# Patient Record
Sex: Male | Born: 1960 | Race: Black or African American | Hispanic: No | Marital: Single | Smoking: Current every day smoker
Health system: Southern US, Community
[De-identification: ages and names within clinical notes are randomized; demographics above are authoritative.]

## PROBLEM LIST (undated history)

## (undated) DIAGNOSIS — I10 Essential (primary) hypertension: Secondary | ICD-10-CM

## (undated) HISTORY — PX: AMPUTATION FINGER / THUMB: SUR24

---

## 1997-12-24 ENCOUNTER — Emergency Department (HOSPITAL_COMMUNITY): Admission: EM | Admit: 1997-12-24 | Discharge: 1997-12-24 | Payer: Self-pay | Admitting: Emergency Medicine

## 1997-12-31 ENCOUNTER — Emergency Department (HOSPITAL_COMMUNITY): Admission: EM | Admit: 1997-12-31 | Discharge: 1997-12-31 | Payer: Self-pay | Admitting: *Deleted

## 1998-01-08 ENCOUNTER — Emergency Department (HOSPITAL_COMMUNITY): Admission: EM | Admit: 1998-01-08 | Discharge: 1998-01-08 | Payer: Self-pay | Admitting: Emergency Medicine

## 1998-04-29 ENCOUNTER — Emergency Department (HOSPITAL_COMMUNITY): Admission: EM | Admit: 1998-04-29 | Discharge: 1998-04-29 | Payer: Self-pay | Admitting: Emergency Medicine

## 1998-09-09 ENCOUNTER — Emergency Department (HOSPITAL_COMMUNITY): Admission: EM | Admit: 1998-09-09 | Discharge: 1998-09-09 | Payer: Self-pay | Admitting: Emergency Medicine

## 1998-09-14 ENCOUNTER — Emergency Department (HOSPITAL_COMMUNITY): Admission: EM | Admit: 1998-09-14 | Discharge: 1998-09-14 | Payer: Self-pay | Admitting: Emergency Medicine

## 1998-11-25 ENCOUNTER — Emergency Department (HOSPITAL_COMMUNITY): Admission: EM | Admit: 1998-11-25 | Discharge: 1998-11-25 | Payer: Self-pay

## 1998-12-23 ENCOUNTER — Encounter: Payer: Self-pay | Admitting: Emergency Medicine

## 1998-12-23 ENCOUNTER — Emergency Department (HOSPITAL_COMMUNITY): Admission: EM | Admit: 1998-12-23 | Discharge: 1998-12-23 | Payer: Self-pay | Admitting: Emergency Medicine

## 2000-08-23 ENCOUNTER — Emergency Department (HOSPITAL_COMMUNITY): Admission: EM | Admit: 2000-08-23 | Discharge: 2000-08-23 | Payer: Self-pay | Admitting: Emergency Medicine

## 2001-03-06 ENCOUNTER — Emergency Department (HOSPITAL_COMMUNITY): Admission: EM | Admit: 2001-03-06 | Discharge: 2001-03-06 | Payer: Self-pay | Admitting: Emergency Medicine

## 2002-03-23 ENCOUNTER — Emergency Department (HOSPITAL_COMMUNITY): Admission: EM | Admit: 2002-03-23 | Discharge: 2002-03-23 | Payer: Self-pay | Admitting: *Deleted

## 2003-09-28 ENCOUNTER — Emergency Department (HOSPITAL_COMMUNITY): Admission: EM | Admit: 2003-09-28 | Discharge: 2003-09-28 | Payer: Self-pay | Admitting: Emergency Medicine

## 2004-11-16 ENCOUNTER — Emergency Department (HOSPITAL_COMMUNITY): Admission: EM | Admit: 2004-11-16 | Discharge: 2004-11-16 | Payer: Self-pay | Admitting: Emergency Medicine

## 2004-11-20 ENCOUNTER — Emergency Department (HOSPITAL_COMMUNITY): Admission: EM | Admit: 2004-11-20 | Discharge: 2004-11-20 | Payer: Self-pay | Admitting: Emergency Medicine

## 2005-01-18 ENCOUNTER — Emergency Department (HOSPITAL_COMMUNITY): Admission: EM | Admit: 2005-01-18 | Discharge: 2005-01-18 | Payer: Self-pay | Admitting: *Deleted

## 2005-07-21 ENCOUNTER — Emergency Department (HOSPITAL_COMMUNITY): Admission: EM | Admit: 2005-07-21 | Discharge: 2005-07-21 | Payer: Self-pay | Admitting: Emergency Medicine

## 2005-10-21 ENCOUNTER — Emergency Department (HOSPITAL_COMMUNITY): Admission: EM | Admit: 2005-10-21 | Discharge: 2005-10-21 | Payer: Self-pay | Admitting: Emergency Medicine

## 2006-09-27 ENCOUNTER — Emergency Department (HOSPITAL_COMMUNITY): Admission: EM | Admit: 2006-09-27 | Discharge: 2006-09-27 | Payer: Self-pay | Admitting: Emergency Medicine

## 2007-04-09 ENCOUNTER — Inpatient Hospital Stay (HOSPITAL_COMMUNITY): Admission: EM | Admit: 2007-04-09 | Discharge: 2007-04-10 | Payer: Self-pay | Admitting: Emergency Medicine

## 2008-02-08 ENCOUNTER — Ambulatory Visit: Payer: Self-pay | Admitting: *Deleted

## 2008-02-08 ENCOUNTER — Ambulatory Visit: Payer: Self-pay | Admitting: Internal Medicine

## 2008-02-08 LAB — CONVERTED CEMR LAB
BUN: 12 mg/dL (ref 6–23)
Basophils Relative: 0 % (ref 0–1)
CO2: 23 meq/L (ref 19–32)
Chloride: 106 meq/L (ref 96–112)
Cholesterol: 178 mg/dL (ref 0–200)
Eosinophils Absolute: 0.2 10*3/uL (ref 0.0–0.7)
Glucose, Bld: 95 mg/dL (ref 70–99)
HDL: 44 mg/dL (ref >39)
LDL Cholesterol: 101 mg/dL — ABNORMAL HIGH (ref 0–99)
Lymphs Abs: 2.4 10*3/uL (ref 0.7–4.0)
MCHC: 32.8 g/dL (ref 30.0–36.0)
MCV: 88.8 fL (ref 78.0–100.0)
Platelets: 302 10*3/uL (ref 150–400)
Potassium: 4.7 meq/L (ref 3.5–5.3)
RDW: 14.8 % (ref 11.5–15.5)
Sodium: 143 meq/L (ref 135–145)
Total CHOL/HDL Ratio: 4
Triglycerides: 163 mg/dL — ABNORMAL HIGH (ref <150)
VLDL: 33 mg/dL (ref 0–40)
WBC: 6.9 10*3/uL (ref 4.0–10.5)

## 2008-04-05 ENCOUNTER — Emergency Department (HOSPITAL_COMMUNITY): Admission: EM | Admit: 2008-04-05 | Discharge: 2008-04-05 | Payer: Self-pay | Admitting: Emergency Medicine

## 2008-07-11 ENCOUNTER — Emergency Department (HOSPITAL_COMMUNITY): Admission: EM | Admit: 2008-07-11 | Discharge: 2008-07-11 | Payer: Self-pay | Admitting: Family Medicine

## 2009-12-27 ENCOUNTER — Emergency Department (HOSPITAL_COMMUNITY): Admission: EM | Admit: 2009-12-27 | Discharge: 2009-12-27 | Payer: Self-pay | Admitting: Emergency Medicine

## 2010-11-10 ENCOUNTER — Inpatient Hospital Stay (INDEPENDENT_AMBULATORY_CARE_PROVIDER_SITE_OTHER)
Admission: RE | Admit: 2010-11-10 | Discharge: 2010-11-10 | Disposition: A | Discharge status: Home / Self Care | Payer: PRIVATE HEALTH INSURANCE | Source: Ambulatory Visit | Attending: Family Medicine | Admitting: Family Medicine

## 2010-11-10 DIAGNOSIS — H00019 Hordeolum externum unspecified eye, unspecified eyelid: Secondary | ICD-10-CM

## 2010-11-11 NOTE — Discharge Summary (Signed)
NAME:  [name], [name] NO.:  [id number]   MEDICAL RECORD NO.:  [id number]          PATIENT TYPE:  INP   LOCATION:  5035                         FACILITY:  [facility]   PHYSICIAN:  Johnette Abraham, MD    DATE OF BIRTH:  [birth year]   DATE OF ADMISSION:  04/09/2007  DATE OF DISCHARGE:  04/10/2007                               DISCHARGE SUMMARY   ADMITTING DIAGNOSES:  1. 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  2. Complex laceration of the left long finger.   PROCEDURES PERFORMED:  Please see the operative note, but the patient  was taken to the operating room for a composite graft and repair of the  left index finger, as well as repair of the left long finger.   HOSPITAL COURSE:  Postoperatively in the PACU, the patient's blood  pressure was elevated.  The patient is a known hypertensive patient who  is noncompliant with his medications; therefore, he was admitted to the  hospital for 23-hour observation.  Upon admission, a hospitalist was  consulted.  Please see their consult note for further recommendations.  They put the patient on lisinopril/hydrochlorothiazide combination for  blood pressure control.  On the morning of discharge, the patient's pain  was controlled.  Extensive conversation was had with the patient again  regarding the unlikelyness that the composite graft would take due to  the nature of the injury.  The patient agreed.  The patient should leave  his dressing intact, and do not remove it.  He is advised to elevate the  extremity.  He is given a prescription for Lortab for pain, and Keflex  to take.  Per recommendation of the hospitalist, he is given a  combination lisinopril/hydrochlorothiazide to take for his blood  pressure.  He is advised to not smoke, that smoking delays and prevents  wound healing and may be a factor in the survivability of the graft.   FOLLOWUP:  The patient should call the office in the morning for a  specific time to  see me in the office, and further followup arrangements  have been made with the hospitalist to see the patient back for a blood  pressure check.      Johnette Abraham, MD  Electronically Signed     HCC/MEDQ  D:  04/10/2007  T:  04/10/2007  Job:  [id number]

## 2010-11-11 NOTE — Op Note (Signed)
NAME:  [name], [name] NO.:  [id number]   MEDICAL RECORD NO.:  [id number]          PATIENT TYPE:  INP   LOCATION:  1830                         FACILITY:  [facility]   PHYSICIAN:  Johnette Abraham, MD    DATE OF BIRTH:  1960-11-29   DATE OF PROCEDURE:  04/09/2007  DATE OF DISCHARGE:                               OPERATIVE REPORT   SURGEON:  Johnette Abraham, MD   PREOPERATIVE DIAGNOSES:  1. Crush injury/partial amputation of the left index finger.  2. Crush/complex laceration to the left long finger.   POSTOPERATIVE DIAGNOSES:  1. Crush injury/partial amputation of the left index finger.  2. Crush/complex laceration to the left long finger.   ANESTHESIA:  General.   ESTIMATED BLOOD LOSS:  Minimal.   SPECIMENS:  None.   No acute complications.   PROCEDURES:  1. Open reduction and internal fixation of distal phalanx of the left      index finger.  2. Repair of nail bed, left index finger.  3. Composite graft tissue, left index finger.  4. Irrigation and debridement of skin and subcutaneous tissue and bone      in left index finger.  5. Exploration of wound, left long finger.  6. Complex closure of wound, left long finger, total of 3.5 cm.   INDICATIONS:  Mr. [name] is a 50 year old gentleman who caught his  fingers in a belt of the lawn mower this afternoon and sustained a  crushing distal amputation of the end of the left index finger as well  as a complex crush laceration to the left long finger.  He presented to  the emergency room department and had hand surgery was consulted  emergently.  The patient was adamant about trying to save the end of the  left index finger.  I cautioned him that after looking at his x-ray and  the appearance of the distal part, which was crushed and in multiple  shredded pieces, that this would be nearly impossible, however, I would  honor his wishes and try.  I did caution him that the likelihood of  success for composite  graft with this type of crush injury, the  survivability was very, very unlikely.  Consent was obtained.   PROCEDURE:  The patient was taken to the operating room.  General  anesthesia was administered.  The upper extremity was prepped in the  normal and sterile fashion.  A tourniquet was placed.  The arm was  exsanguinated and the tourniquet was inflated to 250 mmHg.  Initially,  the left index finger wound was evaluated.  There was complex amputation  that involved more of the volar ulnar side of the digit, at the base of  the nail matrix just distal to the DIP joint.  The bone was exposed.  The condition of the amputated part was also evaluated.  It was passed  to the back table soaked in Betadine solution.  The amputated part was  dirty; had grass and grease throughout the soft tissue.  The nail bed  was crushed an in  at least 5 pieces with some of nail bed missing.  The  distal phalanx bone was comminuted in at least 3 pieces.  The overall  condition of the skin was poor.  Following, a 22-gauge needle was used  to capture two of the fragments of the distal phalanx in the amputated  part to approximate them to the IF.  The pieces of the nail bed were  sutured together with interrupted 6-0 chromic sutures.  The skin was  debrided back and approximated to recreate a somewhat normal appearing  amputated finger tip.  In the meantime, the left index finger was  prepared for accepting the composite graft.  Skin and subcutaneous  tissues were debrided.  The tissues were dirty with grease embedded in  the soft tissues.  The bone was rongeured back approximately 2 mm.  Following, the 22-gauge needle which was holding the distal tip was  placed onto the left index finger and used as a K wire and driven into  the proximal part of the distal phalanx.  This reduced the composite  graft onto the left index finger.  The vessels and nerves at this level  were not repaired due to the location and  condition of them.  Following,  multiple interrupted sutures, 6-0 chromic and 4-0 nylon were used to  approximate the skin around the amputated part.  Next, the laceration to  the left long finger was evaluated.  The wound was explored.  The wound  was more on the ulnar side of the digit.  There were visible small  cutaneous nerves and vessels that were observed to be grossly intact.  This wound was superficial to the insertion of the flexor tendon.  This  wound was also dirty and stained with grease.  The skin and subcutaneous  tissues were carefully debrided, leaving the neurovascular structures  deep.  The laceration was stellate and needed to be closed with multiple  interrupted 5-0 nylon sutures.  The final result of the left long finger  was satisfactory.  Following, the tourniquet was released.  Tourniquet  time total was 43 minutes.  The left long finger tip returned to pink  color with good capillary refill.  The left index finger, all but the  composite graft, returned to normal color with adequate capillary  refill.  The skin edges surrounding the composite graft were pink in  color.  Hemostasis was obtained with minor direct pressure.  Afterwards,  the end of the 22-gauge needle was cut to length.  All wounds were  covered with antibiotic ointment and a sterile dressing.  A large bulky  dressing was placed.  The patient tolerated the procedure well, was  taken to the recovery room in stable condition.   Note, the patient was warned postoperatively that the chances for  survival of this composite graft were very, very poor and that the  patient would need an additional procedure with revision amputation if  this composite graft did not take.  It is my belief that the condition  of the amputated part will not be viable, however, an attempt was made  due to the patient's strong request that an attempt be made to salvage  this part.  He will be placed on IV antibiotics over night  and monitored  closely for signs and symptoms of infection, which will warrant early  operation in order to prevent jeopardizing any more proximal tissue.      Johnette Abraham, MD  Electronically Signed  HCC/MEDQ  D:  04/09/2007  T:  04/10/2007  Job:  [id number]

## 2010-11-11 NOTE — H&P (Signed)
[name], [name]                 ACCOUNT NO.:  [id number]   MEDICAL RECORD NO.:  [id number]           PATIENT TYPE:   LOCATION:                                 FACILITY:   PHYSICIAN:  Deirdre Peer. Polite, M.D. DATE OF BIRTH:  10/21/1950   DATE OF ADMISSION:  DATE OF DISCHARGE:                              HISTORY & PHYSICAL   REASON FOR CONSULTATION:  Hypertension management.   Patient was admitted to the surgery service __________ [name]  __________ .  Patient sustained an injury to his left hand, second and  third finger while trying to fix a running lawn mower.  While an  emergency department, patient was noted to have hypertension.  Blood  pressure is 192/117.  Patient states that he has a history of  hypertension and has been on some medications in the past.  He is unsure  of what those medications are.  Currently his blood pressure is 162/97.  He is alert and oriented; in no apparent distress.  He denies any  issues, any chest pain.  No shortness of breath.  No orthopnea.  No PND.   PAST MEDICAL HISTORY:  As stated above.  Hypertension.   CURRENT MEDICATIONS:  None.   SOCIAL HISTORY:  Positive for tobacco, half pack per day.  Positive for  alcohol, approximately 6-pack every other day.  Denies withdrawal  symptoms.  Denies drugs.   PAST SURGICAL HISTORY:  Recent left hand surgery to repair a laceration  to his second and third fingers.   ALLERGIES:  None.   FAMILY HISTORY:  Mother had cancer.  He is not sure if it was brain or  tumor.  Father hypertension.  One brother with diabetes.  He had 6  sisters.  Two deceased from breast cancer.   REVIEW OF SYSTEMS:  As stated in the HPI.  Otherwise, negative.   PHYSICAL EXAMINATION:  Alert and oriented x3.  Current blood pressure  162/97.  Current vitals are not documented.  Temperature in emergency  department is 99.4.  HEENT:  Unremarkable.  Chest:  Clear without rales,  rhonchi or wheezes.  Cardiovascular:  Regular.   S1, S3.  No S3  appreciated.  Abdomen:  Soft, nontender.  Extremities:  No edema.  Left  hand wrap from recent surgery.   ASSESSMENT:  1. Hypertension.  Probable essential hypertension.  Patient will be      started on lisinopril/hydrochlorothiazide 20/12.5.  Recommend      checking a BMET.  Patient will require other risk factor      stratification which can be done on an outpatient basis.  2. Tobacco use.  At this time, the patient states he is going through      a difficult time, and he wishes to continue      smoking.  3. Alcohol use to excess.  Again, the patient states he is going      through a difficult time.  These issues need to be further      addressed on an outpatient basis.      Deirdre Peer. Polite, M.D.  Electronically Signed     RDP/MEDQ  D:  04/09/2007  T:  04/09/2007  Job:  [id number]

## 2011-04-09 LAB — BASIC METABOLIC PANEL
CO2: 26
Calcium: 8.4
Creatinine, Ser: 0.81
Glucose, Bld: 89

## 2012-03-17 ENCOUNTER — Encounter (HOSPITAL_COMMUNITY): Payer: Self-pay | Admitting: Emergency Medicine

## 2012-03-17 ENCOUNTER — Emergency Department (HOSPITAL_COMMUNITY)
Admission: EM | Admit: 2012-03-17 | Discharge: 2012-03-17 | Disposition: A | Discharge status: Home / Self Care | Payer: Self-pay | Attending: Emergency Medicine | Admitting: Emergency Medicine

## 2012-03-17 DIAGNOSIS — IMO0002 Reserved for concepts with insufficient information to code with codable children: Secondary | ICD-10-CM

## 2012-03-17 DIAGNOSIS — L03019 Cellulitis of unspecified finger: Secondary | ICD-10-CM | POA: Insufficient documentation

## 2012-03-17 HISTORY — DX: Essential (primary) hypertension: I10

## 2012-03-17 MED ORDER — TRAMADOL HCL 50 MG PO TABS: 50.0000 mg | ORAL_TABLET | Freq: Four times a day (QID) | ORAL | Status: DC | PRN

## 2012-03-17 MED ORDER — CEPHALEXIN 500 MG PO CAPS: 1000.0000 mg | ORAL_CAPSULE | Freq: Two times a day (BID) | ORAL | Status: DC

## 2012-03-17 NOTE — ED Notes (Signed)
Pt was called x 2,  Pt returned to desk at 1245, then placed in room Pt alert, c/o swelling on r/thumb. Area around base of nail swollen

## 2012-03-17 NOTE — ED Provider Notes (Signed)
History     CSN: [id number]  Arrival date & time 03/17/12  1105   First MD Initiated Contact with Patient 03/17/12 1301    51 y.o. male in no acute distress complaining of pain to left thumb worsening over the course of 2 days. Patient denies any trauma, fever nausea vomiting.    Chief Complaint  Patient presents with  . Hand Pain    swelling on thumb of r/hand    (Consider location/radiation/quality/duration/timing/severity/associated sxs/prior treatment) The history is provided by the patient.    Past Medical History  Diagnosis Date  . Hypertension     Past Surgical History  Procedure Date  . Amputation finger / thumb     amputation of finger tip on l/hand    Family History  Problem Relation Age of Onset  . Hypertension Father     History  Substance Use Topics  . Smoking status: Current Every Day Smoker    Types: Cigarettes  . Smokeless tobacco: Not on file  . Alcohol Use: Yes      Review of Systems  Constitutional: Negative for fever.  Respiratory: Negative for shortness of breath.   Cardiovascular: Negative for chest pain.  Gastrointestinal: Negative for nausea, vomiting, abdominal pain and diarrhea.  Skin: Positive for rash.  All other systems reviewed and are negative.    Allergies  Review of patient's allergies indicates no known allergies.  Home Medications   Current Outpatient Rx  Name Route Sig Dispense Refill  . LISINOPRIL 20 MG PO TABS Oral Take 20 mg by mouth daily.      BP 124/79  Pulse 59  Temp 98.4 F (36.9 C) (Oral)  Wt 200 lb (90.719 kg)  SpO2 100%  Physical Exam  Nursing note and vitals reviewed. Constitutional: He is oriented to person, place, and time. He appears well-developed and well-nourished. No distress.  HENT:  Head: Normocephalic and atraumatic.  Right Ear: External ear normal.  Left Ear: External ear normal.  Mouth/Throat: Oropharynx is clear and moist.  Eyes: Conjunctivae normal and EOM are normal. Pupils  are equal, round, and reactive to light.  Neck: Normal range of motion.  Cardiovascular: Normal rate, regular rhythm and normal heart sounds.   Pulmonary/Chest: Effort normal and breath sounds normal. No stridor.  Abdominal: Soft. Bowel sounds are normal.  Musculoskeletal: Normal range of motion.  Neurological: He is alert and oriented to person, place, and time.  Skin:       Paronychia to the right thumb, no felon.  Psychiatric: He has a normal mood and affect.    ED Course  Procedures (including critical care time)  Labs Reviewed - No data to display No results found.   1. Paronychia       MDM  51 y.o. y/o male with non-fluctuant paronychia to right thumb, Non fluctuant. I will recommend warm compresses as well.  Pt verbalized understanding and agrees with care plan. Outpatient follow-up and return precautions given.    New Prescriptions   CEPHALEXIN (KEFLEX) 500 MG CAPSULE    Take 2 capsules (1,000 mg total) by mouth 2 (two) times daily.   TRAMADOL (ULTRAM) 50 MG TABLET    Take 1 tablet (50 mg total) by mouth every 6 (six) hours as needed for pain.          Wynetta Emery, PA-C 03/17/12 [phone]

## 2012-03-17 NOTE — ED Notes (Signed)
Pt called to room x 2. No answer. 

## 2012-03-18 NOTE — ED Provider Notes (Signed)
Medical screening examination/treatment/procedure(s) were performed by non-physician practitioner and as supervising physician I was immediately available for consultation/collaboration.   Myrla Malanowski, MD 03/18/12 1541 

## 2012-03-22 ENCOUNTER — Emergency Department (HOSPITAL_COMMUNITY)
Admission: EM | Admit: 2012-03-22 | Discharge: 2012-03-22 | Disposition: A | Discharge status: Home / Self Care | Payer: Self-pay | Attending: Emergency Medicine | Admitting: Emergency Medicine

## 2012-03-22 ENCOUNTER — Encounter (HOSPITAL_COMMUNITY): Payer: Self-pay

## 2012-03-22 DIAGNOSIS — Z8249 Family history of ischemic heart disease and other diseases of the circulatory system: Secondary | ICD-10-CM | POA: Insufficient documentation

## 2012-03-22 DIAGNOSIS — F17200 Nicotine dependence, unspecified, uncomplicated: Secondary | ICD-10-CM | POA: Insufficient documentation

## 2012-03-22 DIAGNOSIS — I10 Essential (primary) hypertension: Secondary | ICD-10-CM | POA: Insufficient documentation

## 2012-03-22 DIAGNOSIS — IMO0002 Reserved for concepts with insufficient information to code with codable children: Secondary | ICD-10-CM

## 2012-03-22 DIAGNOSIS — L03019 Cellulitis of unspecified finger: Secondary | ICD-10-CM | POA: Insufficient documentation

## 2012-03-22 MED ORDER — TRAMADOL HCL 50 MG PO TABS: 50.0000 mg | ORAL_TABLET | Freq: Four times a day (QID) | ORAL | Status: DC | PRN

## 2012-03-22 NOTE — ED Provider Notes (Signed)
Medical screening examination/treatment/procedure(s) were performed by non-physician practitioner and as supervising physician I was immediately available for consultation/collaboration.    Forest Pruden R Jasir Rother, MD 03/22/12 1658 

## 2012-03-22 NOTE — ED Provider Notes (Signed)
History     CSN: [id number]  Arrival date & time 03/22/12  4098   First MD Initiated Contact with Patient 03/22/12 1041      Chief Complaint  Patient presents with  . Wound Check    (Consider location/radiation/quality/duration/timing/severity/associated sxs/prior treatment) HPI  51 y.o. in no acute distress complaining of worsening pain to right hand thumb. Patient was seen for similar and given Keflex and advised warm compresses. Since then the infection has softened. He denies any fever, nausea or vomiting.  Past Medical History  Diagnosis Date  . Hypertension     Past Surgical History  Procedure Date  . Amputation finger / thumb     amputation of finger tip on l/hand    Family History  Problem Relation Age of Onset  . Hypertension Father     History  Substance Use Topics  . Smoking status: Current Every Day Smoker    Types: Cigarettes  . Smokeless tobacco: Not on file  . Alcohol Use: Yes      Review of Systems  Constitutional: Negative for fever.  Respiratory: Negative for shortness of breath.   Cardiovascular: Negative for chest pain.  Gastrointestinal: Negative for nausea, vomiting, abdominal pain and diarrhea.  Skin: Positive for rash.  All other systems reviewed and are negative.    Allergies  Review of patient's allergies indicates no known allergies.  Home Medications   Current Outpatient Rx  Name Route Sig Dispense Refill  . CEPHALEXIN 500 MG PO CAPS Oral Take 2 capsules (1,000 mg total) by mouth 2 (two) times daily. 28 capsule 0  . LISINOPRIL 20 MG PO TABS Oral Take 20 mg by mouth daily.    . TRAMADOL HCL 50 MG PO TABS Oral Take 1 tablet (50 mg total) by mouth every 6 (six) hours as needed for pain. 15 tablet 0    BP 138/85  Pulse 71  Temp 98.5 F (36.9 C) (Oral)  Resp 18  Ht 6\' 1"  (1.854 m)  Wt 210 lb (95.255 kg)  BMI 27.71 kg/m2  SpO2 100%  Physical Exam  Nursing note and vitals reviewed. Constitutional: He is oriented to  person, place, and time. He appears well-developed and well-nourished. No distress.  HENT:  Head: Normocephalic.  Eyes: Conjunctivae normal and EOM are normal.  Cardiovascular: Normal rate.   Pulmonary/Chest: Effort normal. No stridor.  Musculoskeletal: Normal range of motion.  Neurological: He is alert and oriented to person, place, and time.  Skin:       Fluctuant paronychia to right first digit. No felon  Psychiatric: He has a normal mood and affect.    ED Course  Procedures (including critical care time)  INCISION AND DRAINAGE Performed by: Wynetta Emery Consent: Verbal consent obtained. Risks and benefits: risks, benefits and alternatives were discussed Type: abscess  Body area: Right thumb   Drainage: purulent  Drainage amount: 0.5  Packing material: None   Patient tolerance: Patient tolerated the procedure well with no immediate complications.     Labs Reviewed - No data to display No results found.   1. Paronychia       MDM  Paronychia to right thumb in size with purulent drainage.   Pt verbalized understanding and agrees with care plan. Outpatient follow-up and return precautions given.           Wynetta Emery, PA-C 03/22/12 1454

## 2012-03-22 NOTE — ED Notes (Signed)
Pt here for recheck to ?infection to rt hand thumb.  Pt compliant with medication antibiotic yet concerned it has gotten worse- Pt reports tenderness only with yellow drainage

## 2012-08-22 ENCOUNTER — Encounter (HOSPITAL_COMMUNITY): Payer: Self-pay | Admitting: Emergency Medicine

## 2012-08-22 ENCOUNTER — Emergency Department (HOSPITAL_COMMUNITY)
Admission: EM | Admit: 2012-08-22 | Discharge: 2012-08-22 | Disposition: A | Discharge status: Home / Self Care | Payer: BC Managed Care – PPO | Source: Home / Self Care | Attending: Family Medicine | Admitting: Family Medicine

## 2012-08-22 ENCOUNTER — Emergency Department (INDEPENDENT_AMBULATORY_CARE_PROVIDER_SITE_OTHER): Payer: BC Managed Care – PPO

## 2012-08-22 DIAGNOSIS — M7742 Metatarsalgia, left foot: Secondary | ICD-10-CM

## 2012-08-22 DIAGNOSIS — I10 Essential (primary) hypertension: Secondary | ICD-10-CM

## 2012-08-22 DIAGNOSIS — M7750 Other enthesopathy of unspecified foot: Secondary | ICD-10-CM

## 2012-08-22 LAB — POCT I-STAT, CHEM 8
Creatinine, Ser: 1 mg/dL (ref 0.50–1.35)
Glucose, Bld: 95 mg/dL (ref 70–99)
Potassium: 4.2 mEq/L (ref 3.5–5.1)
Sodium: 142 mEq/L (ref 135–145)

## 2012-08-22 MED ORDER — LISINOPRIL-HYDROCHLOROTHIAZIDE 20-12.5 MG PO TABS: 1.0000 | ORAL_TABLET | Freq: Every day | ORAL | Status: DC

## 2012-08-22 MED ORDER — TRAMADOL HCL 50 MG PO TABS: 50.0000 mg | ORAL_TABLET | Freq: Four times a day (QID) | ORAL | Status: DC | PRN

## 2012-08-22 NOTE — ED Notes (Signed)
Patient reports sore left lateral foot.  Patient fell off a roof about a week ago, landed on foot and has had soreness since then.  Patient reports today he has been seeing spots .  Patient reports this is what happens when his blood pressure is elevated.  Patient has been off blood pressure medicine for 2 months.  Patient unable to report name of blood pressure medicine.  Patient denies chest pain.  Reports having headache.

## 2012-08-22 NOTE — ED Provider Notes (Signed)
History     CSN: [id number]  Arrival date & time 08/22/12  1256   First MD Initiated Contact with Patient 08/22/12 1329      Chief Complaint  Patient presents with  . Hypertension    (Consider location/radiation/quality/duration/timing/severity/associated sxs/prior treatment) HPI Comments: 52 year old male smoker with history of hypertension here complaining of left foot pain for one week. Patient states he was working on the for this house slipped and fell down to the ground landing on his left food and then on both hands. Denies neck or back injury. Reports pain with walking or bending his foot. Has not taken any medications for his symptoms. Patient was also found incidentally hypertensive. He states that has not taken his blood pressure medications for 2 months. Patient reports that he didn't have medical insurance. Patient report he does have insurance now and is trying to find a primary care provider. Patient reports intermittent dizziness and visual floaters. Denies chest pain, shortness of breath, headache, balance problems, extremity weakness numbness or paresthesias.    Past Medical History  Diagnosis Date  . Hypertension     Past Surgical History  Procedure Laterality Date  . Amputation finger / thumb      amputation of finger tip on l/hand    Family History  Problem Relation Age of Onset  . Hypertension Father     History  Substance Use Topics  . Smoking status: Current Every Day Smoker    Types: Cigarettes  . Smokeless tobacco: Not on file  . Alcohol Use: Yes      Review of Systems  Constitutional: Negative for fever, chills, diaphoresis and fatigue.  Respiratory: Negative for chest tightness and shortness of breath.   Cardiovascular: Negative for chest pain, palpitations and leg swelling.  Musculoskeletal:       Left foot pain as per HPI  Neurological: Negative for dizziness, seizures, syncope, weakness, numbness and headaches.  All other systems  reviewed and are negative.    Allergies  Review of patient's allergies indicates no known allergies.  Home Medications   Current Outpatient Rx  Name  Route  Sig  Dispense  Refill  . lisinopril-hydrochlorothiazide (PRINZIDE) 20-12.5 MG per tablet   Oral   Take 1 tablet by mouth daily.   30 tablet   1   . traMADol (ULTRAM) 50 MG tablet   Oral   Take 1 tablet (50 mg total) by mouth every 6 (six) hours as needed for pain.   20 tablet   0     BP 181/114  Pulse 83  Temp(Src) 98.8 F (37.1 C) (Oral)  Resp 17  SpO2 100%  Physical Exam  Nursing note and vitals reviewed. Constitutional: He is oriented to person, place, and time. He appears well-developed and well-nourished. No distress.  HENT:  Head: Normocephalic and atraumatic.  Eyes: Conjunctivae and EOM are normal. Pupils are equal, round, and reactive to light. No scleral icterus.  Neck: Neck supple. No JVD present. No thyromegaly present.  Cardiovascular: Normal rate, regular rhythm and normal heart sounds.  Exam reveals no gallop and no friction rub.   No murmur heard. Pulmonary/Chest: Effort normal and breath sounds normal.  Abdominal: Soft. He exhibits no mass.  Musculoskeletal:  Left foot/ankle: no swelling or tender to palpation around malleoli. There is reported tenderness to palpation dorsally over lateral border and also sole between mid and hind foot (lateral tarso-metatarsal area) Normal dorsiflexion and plantaeextension due to pain, no laxity; weight bearing but very tender with walking.  No skin bruise, heamatomas laceration or abrasions.   Neurological: He is alert and oriented to person, place, and time.  Skin: No rash noted. He is not diaphoretic.    ED Course  Procedures (including critical care time)  Labs Reviewed  POCT I-STAT, CHEM 8   Dg Foot Complete Left  08/22/2012  *RADIOLOGY REPORT*  Clinical Data: Foot pain, fall.  LEFT FOOT - COMPLETE 3+ VIEW  Comparison: None.  Findings: No acute bony  abnormality.  Specifically, no acute fracture, subluxation, or dislocation.  Soft tissues are intact.  Probable old injury along the tip of the malleolus with well corticated bone fragment.  IMPRESSION: No acute bony abnormality.   Original Report Authenticated By: Charlett Nose, M.D.      1. Hypertension   2. Metatarsalgia, left     EKG: Sinus rhythm with ventricular rate 78 beats per minutes. Impress poor R wave progression versus right bundle branch block with no acute ischemic changes. No prior EKG for comparison.  MDM  52 y/o male h/o HTN off medications for 4 moths here with HTN (asymptomatic) with normal electrolites and ccreatinine. Also here for left foot injury. No acute fx on xrays.  Refilled lisinopril/hctz12.5mg . Placed on post op shoe. Will avoid NSAID's due to HTN prescribed tramadol.  Encourage follow up with PCP patient has medical insurance I provided list with contact info of primary care offices in our area. Supportive care and red flags that should prompt return to medical attention discussed with patient and provided in writing.         Sharin Grave, MD 08/23/12 [phone]

## 2013-03-27 ENCOUNTER — Encounter (HOSPITAL_COMMUNITY): Payer: Self-pay | Admitting: Emergency Medicine

## 2013-03-27 ENCOUNTER — Emergency Department (HOSPITAL_COMMUNITY)
Admission: EM | Admit: 2013-03-27 | Discharge: 2013-03-27 | Disposition: A | Discharge status: Home / Self Care | Payer: BC Managed Care – PPO | Attending: Emergency Medicine | Admitting: Emergency Medicine

## 2013-03-27 ENCOUNTER — Emergency Department (HOSPITAL_COMMUNITY): Payer: BC Managed Care – PPO

## 2013-03-27 DIAGNOSIS — Y9389 Activity, other specified: Secondary | ICD-10-CM | POA: Insufficient documentation

## 2013-03-27 DIAGNOSIS — M25562 Pain in left knee: Secondary | ICD-10-CM

## 2013-03-27 DIAGNOSIS — I10 Essential (primary) hypertension: Secondary | ICD-10-CM

## 2013-03-27 DIAGNOSIS — M25569 Pain in unspecified knee: Secondary | ICD-10-CM | POA: Insufficient documentation

## 2013-03-27 DIAGNOSIS — M25572 Pain in left ankle and joints of left foot: Secondary | ICD-10-CM

## 2013-03-27 DIAGNOSIS — F17200 Nicotine dependence, unspecified, uncomplicated: Secondary | ICD-10-CM | POA: Insufficient documentation

## 2013-03-27 DIAGNOSIS — X500XXA Overexertion from strenuous movement or load, initial encounter: Secondary | ICD-10-CM | POA: Insufficient documentation

## 2013-03-27 DIAGNOSIS — S8990XA Unspecified injury of unspecified lower leg, initial encounter: Secondary | ICD-10-CM | POA: Insufficient documentation

## 2013-03-27 DIAGNOSIS — Y929 Unspecified place or not applicable: Secondary | ICD-10-CM | POA: Insufficient documentation

## 2013-03-27 DIAGNOSIS — R609 Edema, unspecified: Secondary | ICD-10-CM | POA: Insufficient documentation

## 2013-03-27 MED ORDER — ACETAMINOPHEN 325 MG PO TABS
650.0000 mg | ORAL_TABLET | Freq: Once | ORAL | Status: AC
  Administered 2013-03-27: 650 mg via ORAL
  Filled 2013-03-27: qty 2

## 2013-03-27 MED ORDER — LISINOPRIL-HYDROCHLOROTHIAZIDE 20-12.5 MG PO TABS: 1.0000 | ORAL_TABLET | Freq: Every day | ORAL | Status: DC

## 2013-03-27 NOTE — ED Notes (Addendum)
Pt reports that yesterday he was stepping off some stairs and twisted his left ankle. Pt reports 7/10 pain to the lateral aspect of the ankle. Pt also c/o left sided knee pain. Pt also reports that he ran out of his blood pressure medication three months ago and needs a refill. BP on arrival is 158/114. Pt is A/O x4 and in  NAD.

## 2013-03-27 NOTE — ED Provider Notes (Signed)
CSN: [id number]     Arrival date & time 03/27/13  1024 History  This chart was scribed for non-physician practitioner Coral Ceo, PA-C working with Lyanne Co, MD by Joaquin Music, ED Scribe. This patient was seen in room WTR8/WTR8 and the patient's care was started at 12:36 PM     Chief Complaint  Patient presents with  . Ankle Pain  . Knee Pain  . Medication Refill   The history is provided by the patient. No language interpreter was used.    HPI Comments: [name] is a 52 y.o. male with a PMH of HTN who presents to the Emergency Department complaining of left ankle and knee pain.  He states that he missed the bottom step on the stairs and "rolled his ankle."  He describes an inversion type of injury.  He denies any other injuries.  He reports associated ankle edema with no open wounds, erythema, or ecchymosis.  He complains of throbbing constant pain.  He also reports difficulty with ambulation due to pain but denies any weakness, paresthesias, or loss of sensation.  He has not taken or done anything to tx the symptoms. He has a hx of ankle fx in highschool several years ago.  Patient denies any fever, chills, change in activity/appetite, neck pain, back pain, chest pain, SOB, abdominal pain, nausea, emesis, headache, dizziness, or lightheadedness.  His BP is elevated on today's visit.  He has been out of his lisinopril-HCTZ for the past 3 months.     Past Medical History  Diagnosis Date  . Hypertension    Past Surgical History  Procedure Laterality Date  . 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 Hypertension Father    History  Substance Use Topics  . Smoking status: Current Every Day Smoker    Types: Cigarettes  . Smokeless tobacco: Not on file  . Alcohol Use: Yes    Review of Systems  Constitutional: Negative for fever, chills, activity change, appetite change and fatigue.   HENT: Negative for neck pain.   Eyes: Negative for visual disturbance.  Respiratory: Negative for shortness of breath.   Cardiovascular: Negative for chest pain.  Gastrointestinal: Negative for nausea, vomiting and abdominal pain.  Genitourinary: Negative for dysuria and hematuria.  Musculoskeletal: Positive for joint swelling and gait problem (due to pain). Negative for myalgias and back pain.  Skin: Negative for rash and wound.  Neurological: Negative for dizziness, syncope, weakness, light-headedness, numbness and headaches.  Psychiatric/Behavioral: Negative for confusion.    Allergies  Review of patient's allergies indicates no known allergies.  Home Medications  No current outpatient prescriptions on file. BP 158/114  Pulse 103  Temp(Src) 99.4 F (37.4 C) (Oral)  SpO2 99%  Filed Vitals:   03/27/13 1117 03/27/13 1352 03/27/13 1413  BP: 158/114 172/103 167/109  Pulse: 103 84 76  Temp: 99.4 F (37.4 C) 98.8 F (37.1 C) 98.7 F (37.1 C)  TempSrc: Oral Oral Oral  Resp:  18 18  SpO2: 99% 100% 100%    Physical Exam  Nursing note and vitals reviewed. Constitutional: He is oriented to person, place, and time. He appears well-developed and well-nourished. No distress.  HENT:  Head: Normocephalic and atraumatic.  Right Ear: External ear normal.  Left Ear: External ear normal.  Nose: Nose normal.  Eyes: Conjunctivae and EOM are normal. Right eye exhibits no discharge. Left eye exhibits  no discharge.  Neck: Normal range of motion. Neck supple. No tracheal deviation present.  Cardiovascular: Normal rate, regular rhythm, normal heart sounds and intact distal pulses.  Exam reveals no gallop and no friction rub.   No murmur heard. Dorsalis pedis pulses present bilaterally   Pulmonary/Chest: Effort normal and breath sounds normal. No respiratory distress. He has no wheezes. He has no rales. He exhibits no tenderness.  Abdominal: Soft. He exhibits no distension. There is no  tenderness.  Musculoskeletal: Normal range of motion. He exhibits edema. He exhibits no tenderness.  Edema to the left lateral ankle with tenderness to the lateral malleolus throughout.  Patient able to flex and extend his left knee without difficulty or limitations.  No left knee tenderness.  Patient able to dorsiflex and plantar flex his left ankle.   Neurological: He is alert and oriented to person, place, and time.  Sensation intact in the lower extremities   Skin: Skin is warm and dry. He is not diaphoretic.  No open wounds, erythema, or ecchymosis to the lower extremities throughout  Psychiatric: He has a normal mood and affect. His behavior is normal.    ED Course  Procedures  DIAGNOSTIC STUDIES: Oxygen Saturation is 99% on RA, normal by my interpretation.    COORDINATION OF CARE: 12:38 PM-Discussed treatment plan which includes administering Tylenol with pt at bedside and pt agreed to plan.   Labs Review Labs Reviewed - No data to display Imaging Review Dg Ankle Complete Left  03/27/2013   CLINICAL DATA:  Ankle pain post twisting ankle  EXAM: LEFT ANKLE COMPLETE - 3+ VIEW  COMPARISON:  08/22/2012  FINDINGS: There is no evidence of fracture, dislocation, or joint effusion. There is no evidence of arthropathy or other focal bone abnormality. Soft tissues are unremarkable. Well corticated bony fragment adjacent to distal tibia medial malleolus is stable from prior exam probable from prior injury.  IMPRESSION: Negative.   Electronically Signed   By: Natasha Mead   On: 03/27/2013 13:32   Dg Knee Complete 4 Views Left  03/27/2013   CLINICAL DATA:  Left knee pain following injury.  EXAM: LEFT KNEE - COMPLETE 4+ VIEW  COMPARISON:  None.  FINDINGS: There is no evidence of fracture, dislocation, or joint effusion. There is no evidence of arthropathy or other focal bone abnormality. Soft tissues are unremarkable.  IMPRESSION: No acute abnormality noted.   Electronically Signed   By: Alcide Clever    On: 03/27/2013 13:30   DG Ankle Complete Left (Final result)  Result time: 03/27/13 13:32:23    Final result by Rad Results In Interface (03/27/13 13:32:23)    Narrative:   CLINICAL DATA: Ankle pain post twisting ankle  EXAM: LEFT ANKLE COMPLETE - 3+ VIEW  COMPARISON: 08/22/2012  FINDINGS: There is no evidence of fracture, dislocation, or joint effusion. There is no evidence of arthropathy or other focal bone abnormality. Soft tissues are unremarkable. Well corticated bony fragment adjacent to distal tibia medial malleolus is stable from prior exam probable from prior injury.  IMPRESSION: Negative.   Electronically Signed By: Natasha Mead On: 03/27/2013 13:32             DG Knee Complete 4 Views Left (Final result)  Result time: 03/27/13 13:30:27    Final result by Rad Results In Interface (03/27/13 13:30:27)    Narrative:   CLINICAL DATA: Left knee pain following injury.  EXAM: LEFT KNEE - COMPLETE 4+ VIEW  COMPARISON: None.  FINDINGS: There is no evidence  of fracture, dislocation, or joint effusion. There is no evidence of arthropathy or other focal bone abnormality. Soft tissues are unremarkable.  IMPRESSION: No acute abnormality noted.   Electronically Signed By: Alcide Clever On: 03/27/2013 13:30         MDM   1. Ankle pain, left   2. Knee pain, left   3. Hypertension     [name] is a 52 y.o. male with a PMH of HTN who presents to the Emergency Department complaining of left ankle and knee pain.  X-rays of left ankle and knee ordered.  Tylenol ordered for pain.  Ankle brace and crutches given.    Etiology of ankle and knee pain is possibly due to sprain.  X-rays negative for fx or dislocation. Patient was neurovascularly intact. Patient was prescribed a 30 day supply of lisinopril-HCTZ for outpatient management of his BP.  BP elevated on today's visit likely due to medication non-compliance.  He was instructed to follow-up with his PCP for  further refills and management of his ankle and knee pain. Patient was instructed to return to the ED if they experience any increased edema/erythema, fever, severe pain, weaknes, or other concerns.  Patient was in agreement with discharge and plan.     Final impressions: 1. Ankle pain 2. Knee pain  3. Hypertension     Luiz Iron PA-C   I personally performed the services described in this documentation, which was scribed in my presence. The recorded information has been reviewed and is accurate.    Jillyn Ledger, PA-C 03/28/13 1616

## 2013-03-27 NOTE — Progress Notes (Signed)
Orthopedic Tech Progress Note Patient Details:  [name] Jun 14, 1961 [id number] Ankle ASO applied to Left LE. Crutches fitted for height and comfort Ortho Devices Type of Ortho Device: ASO;Crutches Ortho Device/Splint Location: Left LE Ortho Device/Splint Interventions: Application   [name] 03/27/2013, 1:58 PM

## 2013-03-28 NOTE — ED Provider Notes (Signed)
Medical screening examination/treatment/procedure(s) were performed by non-physician practitioner and as supervising physician I was immediately available for consultation/collaboration.  Lyanne Co, MD 03/28/13 [phone]

## 2013-03-30 NOTE — ED Notes (Signed)
Patient needed note for work 

## 2013-06-02 ENCOUNTER — Ambulatory Visit (INDEPENDENT_AMBULATORY_CARE_PROVIDER_SITE_OTHER): Payer: BC Managed Care – PPO | Admitting: Emergency Medicine

## 2013-06-02 ENCOUNTER — Encounter (HOSPITAL_COMMUNITY): Payer: Self-pay | Admitting: Emergency Medicine

## 2013-06-02 ENCOUNTER — Emergency Department (HOSPITAL_COMMUNITY)
Admission: EM | Admit: 2013-06-02 | Discharge: 2013-06-02 | Disposition: A | Discharge status: Home / Self Care | Payer: BC Managed Care – PPO | Attending: Emergency Medicine | Admitting: Emergency Medicine

## 2013-06-02 VITALS — BP 180/118 | HR 95 | Temp 98.4°F | Resp 16 | Ht 71.0 in | Wt 209.2 lb

## 2013-06-02 DIAGNOSIS — Z9114 Patient's other noncompliance with medication regimen: Secondary | ICD-10-CM

## 2013-06-02 DIAGNOSIS — I10 Essential (primary) hypertension: Secondary | ICD-10-CM

## 2013-06-02 DIAGNOSIS — F17200 Nicotine dependence, unspecified, uncomplicated: Secondary | ICD-10-CM | POA: Insufficient documentation

## 2013-06-02 DIAGNOSIS — Z91199 Patient's noncompliance with other medical treatment and regimen due to unspecified reason: Secondary | ICD-10-CM | POA: Insufficient documentation

## 2013-06-02 DIAGNOSIS — Z569 Unspecified problems related to employment: Secondary | ICD-10-CM

## 2013-06-02 DIAGNOSIS — R42 Dizziness and giddiness: Secondary | ICD-10-CM

## 2013-06-02 DIAGNOSIS — Z566 Other physical and mental strain related to work: Secondary | ICD-10-CM

## 2013-06-02 DIAGNOSIS — Z9119 Patient's noncompliance with other medical treatment and regimen: Secondary | ICD-10-CM | POA: Insufficient documentation

## 2013-06-02 LAB — POCT CBC
MCH, POC: 30.3 pg (ref 27–31.2)
MCHC: 31.7 g/dL — AB (ref 31.8–35.4)
MID (cbc): 0.5 (ref 0–0.9)
MPV: 9.2 fL (ref 0–99.8)
POC Granulocyte: 2.5 (ref 2–6.9)
Platelet Count, POC: 257 10*3/uL (ref 142–424)
RBC: 4.89 M/uL (ref 4.69–6.13)

## 2013-06-02 LAB — COMPREHENSIVE METABOLIC PANEL
ALT: 31 U/L (ref 0–53)
Alkaline Phosphatase: 74 U/L (ref 39–117)
BUN: 11 mg/dL (ref 6–23)
CO2: 27 mEq/L (ref 19–32)
Calcium: 9.2 mg/dL (ref 8.4–10.5)
Potassium: 4 mEq/L (ref 3.5–5.3)
Sodium: 139 mEq/L (ref 135–145)
Total Bilirubin: 0.3 mg/dL (ref 0.3–1.2)
Total Protein: 7.1 g/dL (ref 6.0–8.3)

## 2013-06-02 LAB — GLUCOSE, POCT (MANUAL RESULT ENTRY): POC Glucose: 77 mg/dl (ref 70–99)

## 2013-06-02 MED ORDER — LISINOPRIL 20 MG PO TABS
20.0000 mg | ORAL_TABLET | Freq: Once | ORAL | Status: DC
  Filled 2013-06-02: qty 1

## 2013-06-02 MED ORDER — LISINOPRIL-HYDROCHLOROTHIAZIDE 20-12.5 MG PO TABS: 1.0000 | ORAL_TABLET | Freq: Every day | ORAL | Status: DC

## 2013-06-02 MED ORDER — HYDROCHLOROTHIAZIDE 12.5 MG PO CAPS
12.5000 mg | ORAL_CAPSULE | Freq: Once | ORAL | Status: DC
  Filled 2013-06-02: qty 1

## 2013-06-02 NOTE — ED Notes (Signed)
Pt advised that medication was in route from pharmacy.  Pt advised that a note would sent to pharmacy to get medication.  Nurse came to desk to send note and when nurse returned patient had left on his own accord.  Pt was not given his medication.

## 2013-06-02 NOTE — Patient Instructions (Signed)
Please Go straight to [name] for evaluation.

## 2013-06-02 NOTE — ED Notes (Signed)
Per pt has dizzy spell at work this am, lasted 30 minutes-resolved-saw PCP and he advised ED eval for CT, etc

## 2013-06-02 NOTE — ED Provider Notes (Signed)
Medical screening examination/treatment/procedure(s) were performed by non-physician practitioner and as supervising physician I was immediately available for consultation/collaboration.  EKG Interpretation   None         Lyanne Co, MD 06/02/13 2215

## 2013-06-02 NOTE — ED Provider Notes (Signed)
CSN: [id number]     Arrival date & time 06/02/13  1727 History   First MD Initiated Contact with Patient 06/02/13 1802     Chief Complaint  Patient presents with  . Hypertension   (Consider location/radiation/quality/duration/timing/severity/associated sxs/prior Treatment) Patient is a 52 y.o. male presenting with hypertension. The history is provided by the patient. No language interpreter was used.  Hypertension This is a recurrent problem. Pertinent negatives include no chills, fever, headaches, numbness or weakness. Associated symptoms comments: He has a history of high blood pressure, out of current medications for about one month. He saw his doctor this afternoon for a refill of medications and was sent here for evaluation of reported dizzy spell he has this morning around 11:30 that lasted approximately 10 minutes. No pain, visual change, nausea, headache, loss of coordination or imbalance. .    Past Medical History  Diagnosis Date  . Hypertension    Past Surgical History  Procedure Laterality Date  . 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 Hypertension Father    History  Substance Use Topics  . Smoking status: Current Every Day Smoker    Types: Cigarettes  . Smokeless tobacco: Not on file  . Alcohol Use: Yes    Review of Systems  Constitutional: Negative for fever and chills.  HENT: Negative.   Respiratory: Negative.   Cardiovascular: Negative.   Gastrointestinal: Negative.   Genitourinary: Negative.   Musculoskeletal: Negative.   Skin: Negative.   Neurological: Positive for dizziness. Negative for weakness, numbness and headaches.    Allergies  Review of patient's allergies indicates no known allergies.  Home Medications  No current outpatient prescriptions on file. BP 185/109  Pulse 80  Resp 18  SpO2 100% Physical Exam  Constitutional: He is oriented to person, place, and  time. He appears well-developed and well-nourished.  HENT:  Head: Normocephalic.  Neck: Normal range of motion. Neck supple.  Cardiovascular: Normal rate and regular rhythm.   No carotid bruit.  Pulmonary/Chest: Effort normal and breath sounds normal. He has no wheezes. He has no rales.  Abdominal: Soft. Bowel sounds are normal. There is no tenderness. There is no rebound and no guarding.  Musculoskeletal: Normal range of motion. He exhibits no tenderness.  Neurological: He is alert and oriented to person, place, and time. He exhibits normal muscle tone. Coordination normal.  Cranial nerves 3-12 grossly intact. No gait imbalance. Negative romberg, no pronator drift.   Skin: Skin is warm and dry. No rash noted.  Psychiatric: He has a normal mood and affect.    ED Course  Procedures (including critical care time) Labs Review Labs Reviewed - No data to display Imaging Review No results found.  EKG Interpretation   None       MDM  No diagnosis found. 1. Hypertension 2. Medication noncompliance  Medication refill was given by his doctor earlier today. His dizziness lasted a brief period after which he finished his usual work day. At no time did he report any deficits of function or sensation. His exam here is normal without neurologic deficits. No chest pain, SOB. Stable for discharge.     Arnoldo Hooker, PA-C 06/02/13 7829

## 2013-06-02 NOTE — Progress Notes (Signed)
Subjective:    Patient ID: [name], male    DOB: 1960/12/04, 51 y.o.   MRN: [id number]  This chart was scribed for Collene Gobble, MD by Dorothey Baseman, ED Scribe.   Chief Complaint  Patient presents with   Medication Refill    lisinopril-hydroch. 20-12.5 out x 1 month    Headache    noticed symptoms upon being out of med   Dizziness    HPI [name] is a 51 y.o. male who presents to [facility] requesting a refill of his prescription for lisinopril and for a blood pressure check. He reports that he ran out of his medication about one month ago. Patient reports some associated dizziness and lightheadedness onset this morning that lasted about an hour and has since resolved about 3-4 hours ago. Patient reports that these symptoms are new for him. He states that he did not eat breakfast this morning. He reports experiencing some recent stress at home due to the recent increasing demands of his job. He denies chest pain, difficult speech. He reports that he is a current, every day smoker and an occasional, social drinker. Patient denies any other pertinent medical history.   Past Medical History  Diagnosis Date   Hypertension    Current Outpatient Prescriptions on File Prior to Visit  Medication Sig Dispense Refill   lisinopril-hydrochlorothiazide (PRINZIDE,ZESTORETIC) 20-12.5 MG per tablet Take 1 tablet by mouth daily.       [DISCONTINUED] lisinopril (PRINIVIL,ZESTRIL) 20 MG tablet Take 20 mg by mouth daily.       No current facility-administered medications on file prior to visit.   No Known Allergies  Review of Systems  Cardiovascular: Negative for chest pain.  Neurological: Positive for dizziness ( resolved) and light-headedness ( resolved). Negative for speech difficulty.      Objective:   Physical Exam CONSTITUTIONAL: Well developed/well nourished patient sweaty but he is in no acute distress. HEAD: Normocephalic/atraumatic EYES:  EOMI/PERRL ENMT: Mucous membranes moist, oropharynx is clear and moist NECK: supple no meningeal signs SPINE:entire spine nontender CV: S1/S2 noted, no murmurs/rubs/gallops noted, regular rate and rhythm, blood pressure is 170/116 on the left and 170/110 on the right  LUNGS: Lungs are clear to auscultation bilaterally, no apparent distress, normal breath sounds ABDOMEN: soft, nontender, no rebound or guarding GU:no cva tenderness NEURO: Pt is awake/alert, moves all extremitiesx4, patellar and brachioradialis reflexes are 2+ bilaterally, no cranial nerve deficit  EXTREMITIES: pulses normal, full ROM SKIN: warm, color normal, slightly clammy  PSYCH: no abnormalities of mood noted  BP 180/118   Pulse 95   Temp(Src) 98.4 F (36.9 C) (Oral)   Resp 16   Ht 5\' 11"  (1.803 m)   Wt 209 lb 3.2 oz (94.892 kg)   BMI 29.19 kg/m2   SpO2 100%  Results for orders placed in visit on 06/02/13  POCT CBC      Result Value Range   WBC 5.5  4.6 - 10.2 K/uL   Lymph, poc 2.5  0.6 - 3.4   POC LYMPH PERCENT 46.0  10 - 50 %L   MID (cbc) 0.5  0 - 0.9   POC MID % 8.5  0 - 12 %M   POC Granulocyte 2.5  2 - 6.9   Granulocyte percent 45.5  37 - 80 %G   RBC 4.89  4.69 - 6.13 M/uL   Hemoglobin 14.8  14.1 - 18.1 g/dL   HCT, POC 30.8  65.7 - 53.7 %  MCV 95.4  80 - 97 fL   MCH, POC 30.3  27 - 31.2 pg   MCHC 31.7 (*) 31.8 - 35.4 g/dL   RDW, POC 16.1     Platelet Count, POC 257  142 - 424 K/uL   MPV 9.2  0 - 99.8 fL  GLUCOSE, POCT (MANUAL RESULT ENTRY)      Result Value Range   POC Glucose 77  70 - 99 mg/dl        Assessment & Plan:  4:12 PM- Will order blood labs and an EKG. Will order a CT of the head. Discussed treatment plan with patient at bedside and patient verbalized agreement. Pressure is out of control. He had an episode today of dizziness which lasted about an hour. He needs to have imaging to be sure he did not have a mini stroke. I did refill his blood pressure medications. I called his wife and they  are going to send his daughter over to pick him up and take him to the emergency room for further evaluation.

## 2013-06-03 ENCOUNTER — Telehealth: Payer: Self-pay | Admitting: Emergency Medicine

## 2013-06-03 NOTE — Telephone Encounter (Signed)
Call labs normal. Be sure dizziness is gone . RTC if continued problems with dizziness.

## 2013-06-05 NOTE — Telephone Encounter (Signed)
LMOM to CB. 

## 2013-06-06 NOTE — Telephone Encounter (Addendum)
Called him. Left another message, advised labs normal return to clinic if dizziness continues, advised to call me if he has questions.

## 2013-06-19 ENCOUNTER — Telehealth: Payer: Self-pay | Admitting: Radiology

## 2013-06-19 NOTE — Telephone Encounter (Signed)
Patient states dizziness has persists, advised him to return for this. He understands.

## 2013-12-18 ENCOUNTER — Encounter (HOSPITAL_COMMUNITY): Payer: Self-pay | Admitting: Emergency Medicine

## 2013-12-18 ENCOUNTER — Emergency Department (INDEPENDENT_AMBULATORY_CARE_PROVIDER_SITE_OTHER)
Admission: EM | Admit: 2013-12-18 | Discharge: 2013-12-18 | Disposition: A | Discharge status: Home / Self Care | Payer: BC Managed Care – PPO | Source: Home / Self Care | Attending: Family Medicine | Admitting: Family Medicine

## 2013-12-18 DIAGNOSIS — L259 Unspecified contact dermatitis, unspecified cause: Secondary | ICD-10-CM

## 2013-12-18 MED ORDER — PREDNISONE 10 MG PO TABS: ORAL_TABLET | ORAL | Status: DC

## 2013-12-18 MED ORDER — BETAMETHASONE DIPROPIONATE AUG 0.05 % EX CREA: TOPICAL_CREAM | Freq: Two times a day (BID) | CUTANEOUS | Status: DC

## 2013-12-18 NOTE — Discharge Instructions (Signed)

## 2013-12-18 NOTE — ED Notes (Signed)
Rash  w itching. [place] outside [place] [employer], concerned about poison ivy

## 2013-12-18 NOTE — ED Provider Notes (Signed)
CSN: [id number]     Arrival date & time 12/18/13  1438 History   First MD Initiated Contact with Patient 12/18/13 1543     Chief Complaint  Patient presents with  . Rash   (Consider location/radiation/quality/duration/timing/severity/associated sxs/prior Treatment) HPI Comments: 53 year old male presents for evaluation of a rash around his left arm and elbow. This happened after he was working around some bushes about one week ago gotten worse. He has history of this at home with calamine lotion but it is not getting better. The rash itches and stings when it is touched. He denies any systemic symptoms or previous history of similar rashes. It started as a small patch and has slowly spread outward.   Past Medical History  Diagnosis Date  . Hypertension    Past Surgical History  Procedure Laterality Date  . 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 Hypertension Father    History  Substance Use Topics  . Smoking status: Current Every Day Smoker    Types: Cigarettes  . Smokeless tobacco: Not on file  . Alcohol Use: Yes    Review of Systems  Skin: Positive for rash.  All other systems reviewed and are negative.   Allergies  Review of patient's allergies indicates no known allergies.  Home Medications   Prior to Admission medications   Medication Sig Start Date End Date Taking? Authorizing Provider  augmented betamethasone dipropionate (DIPROLENE AF) 0.05 % cream Apply topically 2 (two) times daily. 12/18/13   Graylon GoodZachary H Baker, PA-C  lisinopril-hydrochlorothiazide (PRINZIDE,ZESTORETIC) 20-12.5 MG per tablet Take 1 tablet by mouth daily.    Historical Provider, MD  predniSONE (DELTASONE) 10 MG tablet 4 tabs PO QD for 4 days; 3 tabs PO QD for 3 days; 2 tabs PO QD for 2 days; 1 tab PO QD for 1 day 12/18/13   Graylon GoodZachary H Baker, PA-C   BP 147/84  Pulse 78  Temp(Src) 98.5 F (36.9 C) (Oral)  Resp 16  SpO2  97% Physical Exam  Nursing note and vitals reviewed. Constitutional: He is oriented to person, place, and time. He appears well-developed and well-nourished. No distress.  HENT:  Head: Normocephalic.  Pulmonary/Chest: Effort normal. No respiratory distress.  Neurological: He is alert and oriented to person, place, and time. Coordination normal.  Skin: Skin is warm and dry. Rash (Small erythematous papules and vesicles with some mild diffuse swelling, diffusely spread about the extensor surface of the left elbow ) noted. He is not diaphoretic.  Psychiatric: He has a normal mood and affect. Judgment normal.    ED Course  Procedures (including critical care time) Labs Review Labs Reviewed - No data to display  Imaging Review No results found.   MDM   1. Contact dermatitis    Likely not poison ivy, but contact dermatitis of some sort. Treat with steroid cream, given a prescription for oral steroids with told to wait a few days and see if the topical steroid is helping BEFORE starting it, he will start the oral steroid if no improvement from the topical treatment. Followup when necessary  Meds ordered this encounter  Medications  . augmented betamethasone dipropionate (DIPROLENE AF) 0.05 % cream    Sig: Apply topically 2 (two) times daily.    Dispense:  50 g    Refill:  0    Order Specific Question:  Supervising Provider    Answer:  KELLER, DAVID C [[id number]]  . predniSONE (DELTASONE) 10 MG tablet    Sig: 4 tabs PO QD for 4 days; 3 tabs PO QD for 3 days; 2 tabs PO QD for 2 days; 1 tab PO QD for 1 day    Dispense:  30 tablet    Refill:  0    Order Specific Question:  Supervising Provider    Answer:  Lorenz CoasterKELLER, DAVID C [[id number]]     Graylon GoodZachary H Baker, PA-C 12/18/13 1647

## 2013-12-19 NOTE — ED Provider Notes (Signed)
Medical screening examination/treatment/procedure(s) were performed by resident physician or non-physician practitioner and as supervising physician I was immediately available for consultation/collaboration.   KINDL,JAMES DOUGLAS MD.   James D Kindl, MD 12/19/13 1508 

## 2014-05-12 IMAGING — CR DG KNEE COMPLETE 4+V*L*
4 series · 4 of 4 positions shown · non-contrast
Comparison: None.

CLINICAL DATA: Left knee pain following injury.

EXAM:
LEFT KNEE - COMPLETE 4+ VIEW

[t knee ap left]
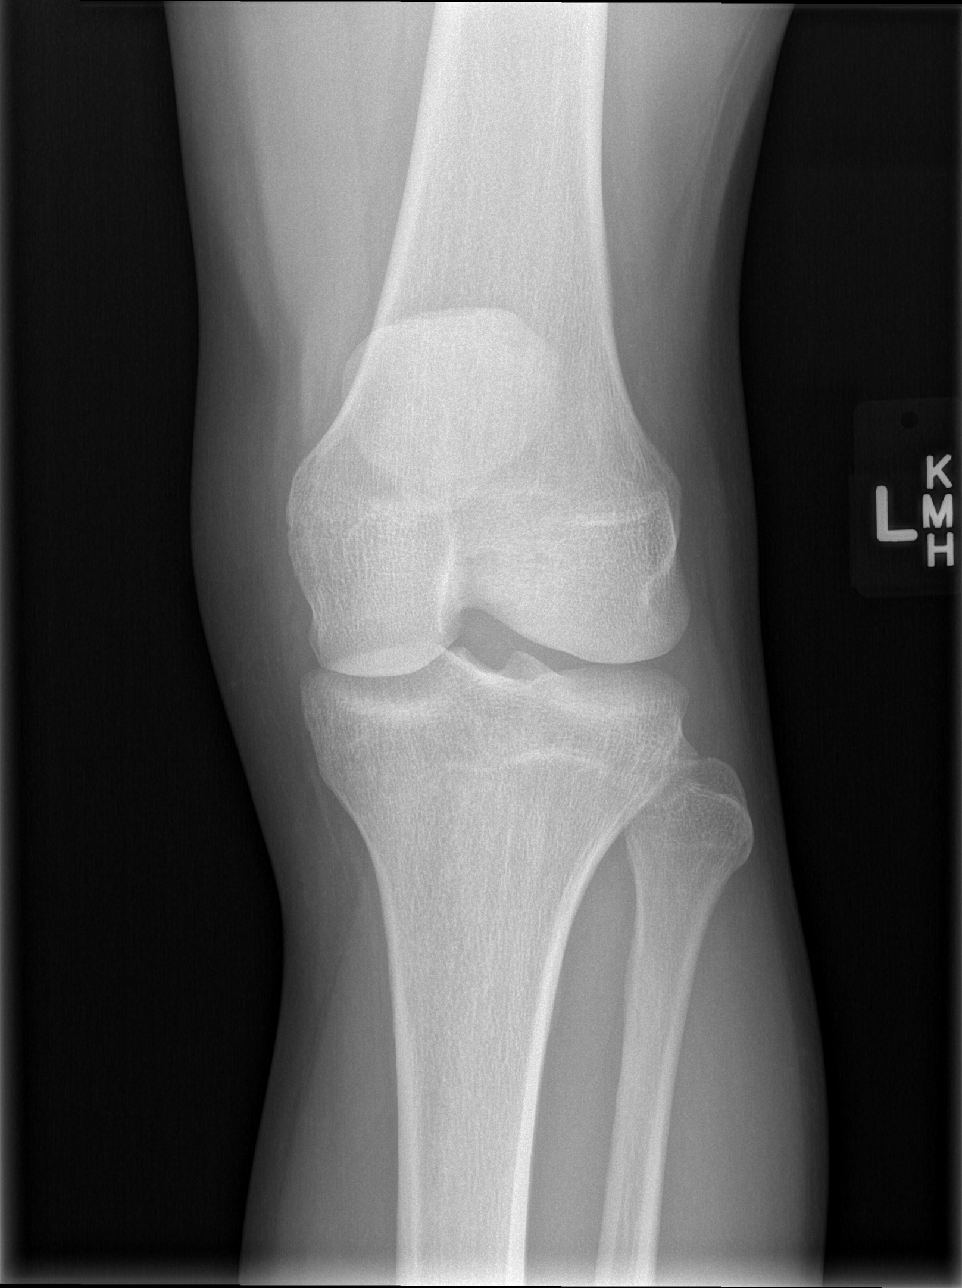

[t knee obl left (1 of 2)]
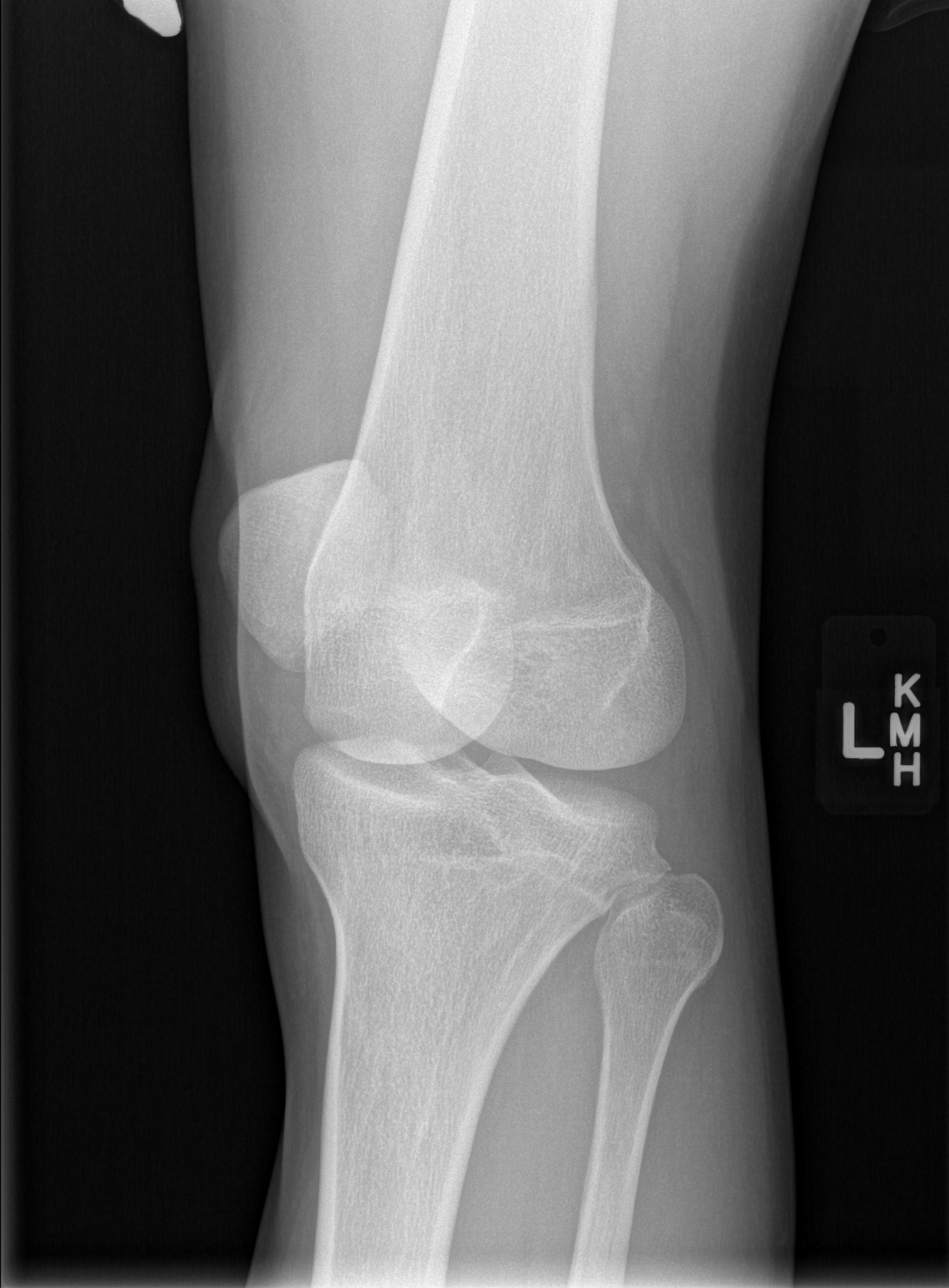

[t knee obl left (2 of 2)]
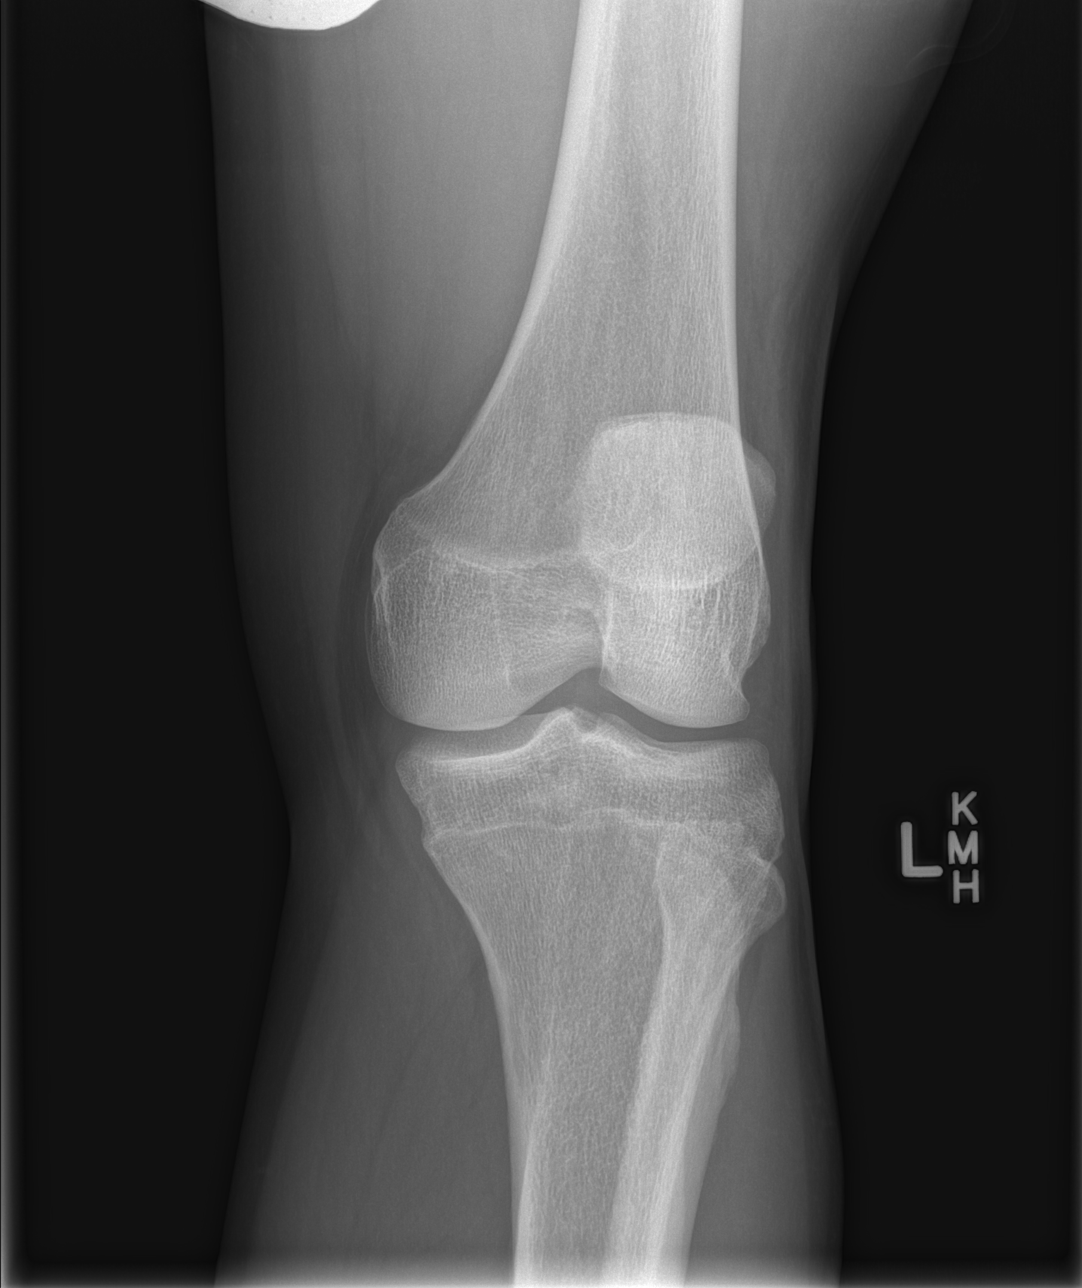

[t knee lat left]
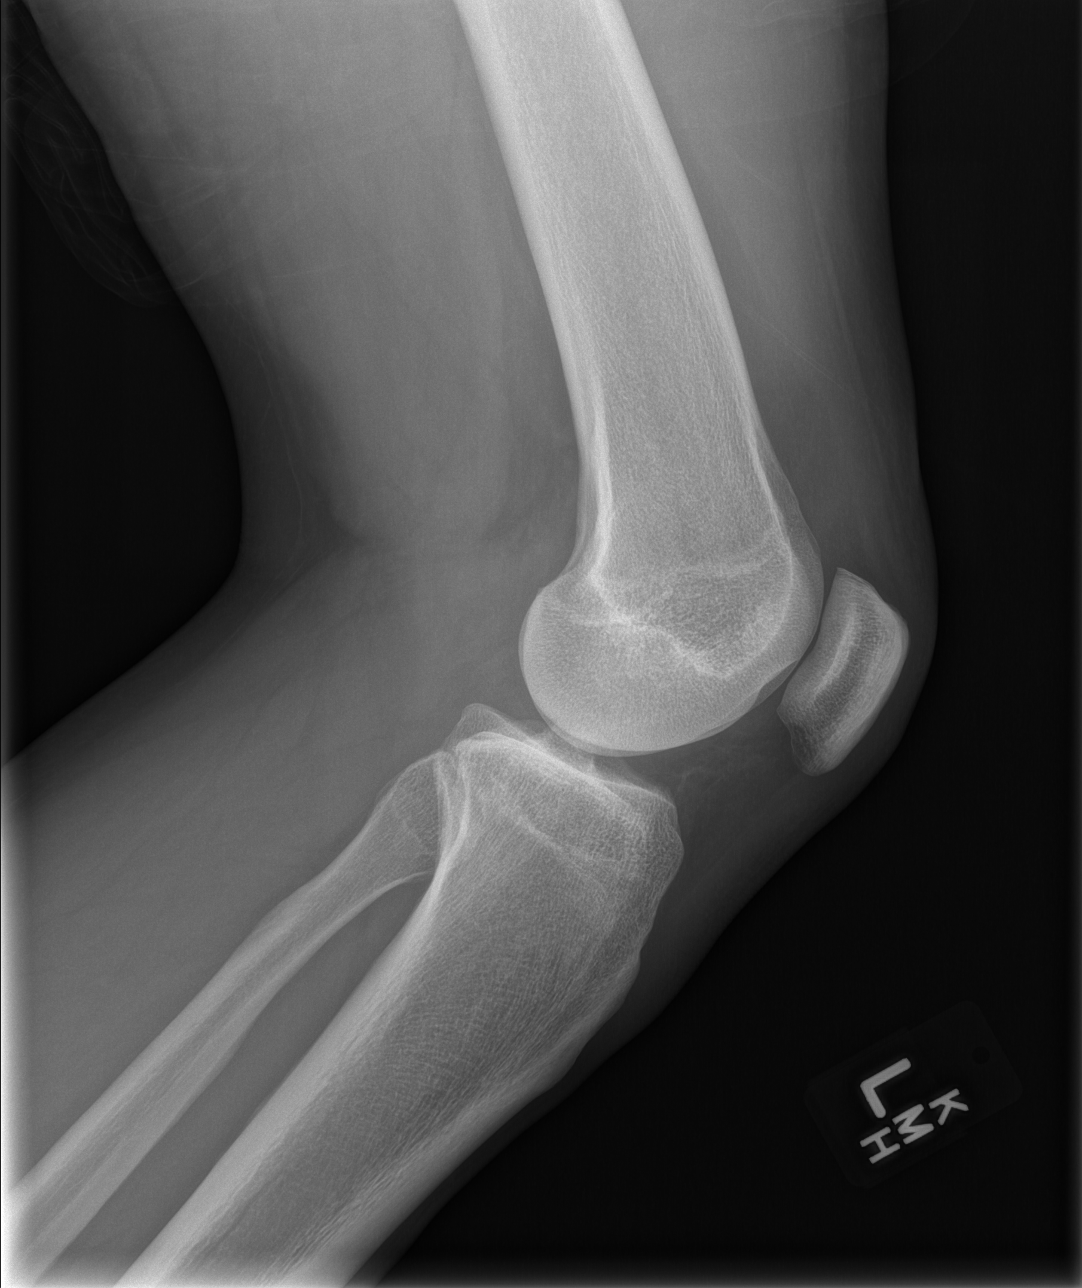

[4 of 4 positions shown; findings below may reference images not displayed]

FINDINGS: There is no evidence of fracture, dislocation, or joint effusion.
There is no evidence of arthropathy or other focal bone abnormality.
Soft tissues are unremarkable.
IMPRESSION: No acute abnormality noted.

## 2014-06-27 ENCOUNTER — Other Ambulatory Visit: Payer: Self-pay | Admitting: Emergency Medicine

## 2014-06-27 ENCOUNTER — Ambulatory Visit: Payer: Self-pay

## 2014-07-08 ENCOUNTER — Ambulatory Visit (INDEPENDENT_AMBULATORY_CARE_PROVIDER_SITE_OTHER): Payer: BLUE CROSS/BLUE SHIELD | Admitting: Family Medicine

## 2014-07-08 VITALS — BP 174/112 | HR 102 | Temp 98.8°F | Resp 16 | Ht 71.5 in | Wt 209.1 lb

## 2014-07-08 DIAGNOSIS — B351 Tinea unguium: Secondary | ICD-10-CM

## 2014-07-08 DIAGNOSIS — I10 Essential (primary) hypertension: Secondary | ICD-10-CM

## 2014-07-08 MED ORDER — TERBINAFINE HCL 250 MG PO TABS: 250.0000 mg | ORAL_TABLET | Freq: Every day | ORAL | Status: DC

## 2014-07-08 MED ORDER — LISINOPRIL-HYDROCHLOROTHIAZIDE 20-12.5 MG PO TABS: 1.0000 | ORAL_TABLET | Freq: Every day | ORAL | Status: DC

## 2014-07-08 NOTE — Progress Notes (Signed)
Patient ID: [name] MRN: [id number], DOB: 1961/01/20, 54 y.o. Date of Encounter: 07/08/2014, 2:43 PM  Primary Physician: No PCP Per Patient  Chief Complaint: HTN  HPI: 54 y.o. year old male with5 year history below presents for hypertension follow up.  His job involves walking and going up and down stairs.  Diet consists of regular foods, careful  About salt No CP, HA, visual changes, or focal deficits.   Past Medical History  Diagnosis Date  . Hypertension      Home Meds: Prior to Admission medications   Medication Sig Start Date End Date Taking? Authorizing Provider  augmented betamethasone dipropionate (DIPROLENE AF) 0.05 % cream Apply topically 2 (two) times daily. 12/18/13  Yes Graylon Good, PA-C  lisinopril-hydrochlorothiazide (PRINZIDE,ZESTORETIC) 20-12.5 MG per tablet Take 1 tablet by mouth daily.   Yes Historical Provider, MD    Allergies: No Known Allergies  History   Social History  . Marital Status: Single    Spouse Name: N/A    Number of Children: N/A  . Years of Education: N/A   Occupational History  . Not on file.   Social History Main Topics  . Smoking status: Current Every Day Smoker    Types: Cigarettes  . Smokeless tobacco: Never Used  . Alcohol Use: 0.0 oz/week    0 Not specified per week  . Drug Use: No  . Sexual Activity: Not on file   Other Topics Concern  . Not on file   Social History Narrative     Family History  Problem Relation Age of Onset  . Hypertension Father     Review of Systems: Constitutional: negative for chills, fever, night sweats, weight changes, or fatigue  HEENT: negative for vision changes, hearing loss, congestion, rhinorrhea, ST, epistaxis, or sinus pressure Cardiovascular: negative for chest pain, palpitations, or DOE Respiratory: negative for hemoptysis, wheezing, shortness of breath, or cough Abdominal: negative for abdominal pain, nausea, vomiting, diarrhea, or constipation Dermatological: negative  for rash Neurologic: negative for headache, dizziness, or syncope All other systems reviewed and are otherwise negative with the exception to those above and in the HPI.   Physical Exam: Blood pressure 174/112, pulse 102, temperature 98.8 F (37.1 C), temperature source Oral, resp. rate 16, height 5' 11.5" (1.816 m), weight 209 lb 2 oz (94.858 kg), SpO2 99 %., Body mass index is 28.76 kg/(m^2). BP Readings from Last 3 Encounters:  07/08/14 174/112  12/18/13 147/84  06/02/13 184/105   Wt Readings from Last 3 Encounters:  07/08/14 209 lb 2 oz (94.858 kg)  06/02/13 209 lb 3.2 oz (94.892 kg)  03/22/12 210 lb (95.255 kg)   General: Well developed, well nourished, in no acute distress. Head: Normocephalic, atraumatic, eyes without discharge, sclera non-icteric, nares are without discharge. Bilateral auditory canals clear, TM's are without perforation, pearly grey and translucent with reflective cone of light bilaterally. Oral cavity moist, posterior pharynx without exudate, erythema, peritonsillar abscess, or post nasal drip.  Neck: Supple. No thyromegaly. Full ROM. No lymphadenopathy. No carotid bruits. Lungs: Clear bilaterally to auscultation without wheezes, rales, or rhonchi. Breathing is unlabored. Heart: RRR with S1 S2. No murmurs, rubs, or gallops appreciated.  Abdomen: Soft, non-tender, non-distended with normoactive bowel sounds. No hepatosplenomegaly. No rebound/guarding. No obvious abdominal masses. Msk:  Strength and tone normal for age. Extremities/Skin: Warm and dry. No clubbing or cyanosis. No edema. No rashes or suspicious lesions. Distal pulses 2+ and equal bilaterally.  Right thumb nail irregularity c/w onychomycosis Neuro: Alert and  oriented X 3. Moves all extremities spontaneously. Gait is normal. CNII-XII grossly in tact. DTR 2+, cerebellar function intact. Rhomberg normal. Psych:  Responds to questions appropriately with a normal affect.    ASSESSMENT AND PLAN:  54 y.o.  year old male with      ICD-9-CM ICD-10-CM   1. Essential hypertension 401.9 I10 lisinopril-hydrochlorothiazide (PRINZIDE,ZESTORETIC) 20-12.5 MG per tablet  2. Onychomycosis 110.1 B35.1 terbinafine (LAMISIL) 250 MG tablet     Signed, Elvina Sidle, MD  -  Signed, Elvina Sidle, MD 07/08/2014 2:43 PM

## 2014-10-08 ENCOUNTER — Ambulatory Visit (INDEPENDENT_AMBULATORY_CARE_PROVIDER_SITE_OTHER): Payer: BLUE CROSS/BLUE SHIELD | Admitting: Family Medicine

## 2014-10-08 VITALS — BP 140/80 | HR 114 | Temp 101.3°F | Resp 18 | Ht 71.5 in | Wt 203.8 lb

## 2014-10-08 DIAGNOSIS — R059 Cough, unspecified: Secondary | ICD-10-CM

## 2014-10-08 DIAGNOSIS — B351 Tinea unguium: Secondary | ICD-10-CM | POA: Diagnosis not present

## 2014-10-08 DIAGNOSIS — J019 Acute sinusitis, unspecified: Secondary | ICD-10-CM | POA: Diagnosis not present

## 2014-10-08 DIAGNOSIS — I10 Essential (primary) hypertension: Secondary | ICD-10-CM | POA: Diagnosis not present

## 2014-10-08 DIAGNOSIS — Z79899 Other long term (current) drug therapy: Secondary | ICD-10-CM | POA: Diagnosis not present

## 2014-10-08 DIAGNOSIS — R519 Headache, unspecified: Secondary | ICD-10-CM

## 2014-10-08 DIAGNOSIS — R05 Cough: Secondary | ICD-10-CM | POA: Diagnosis not present

## 2014-10-08 DIAGNOSIS — R51 Headache: Secondary | ICD-10-CM | POA: Diagnosis not present

## 2014-10-08 LAB — POCT CBC
Granulocyte percent: 78.3 %G (ref 37–80)
HCT, POC: 45.1 % (ref 43.5–53.7)
Hemoglobin: 14.6 g/dL (ref 14.1–18.1)
Lymph, poc: 1.2 (ref 0.6–3.4)
MCH: 28.7 pg (ref 27–31.2)
MCHC: 32.3 g/dL (ref 31.8–35.4)
MCV: 88.7 fL (ref 80–97)
MID (cbc): 0.1 (ref 0–0.9)
MPV: 7.6 fL (ref 0–99.8)
POC GRANULOCYTE: 4.5 (ref 2–6.9)
POC LYMPH PERCENT: 20.5 %L (ref 10–50)
POC MID %: 1.2 % (ref 0–12)
Platelet Count, POC: 197 10*3/uL (ref 142–424)
RBC: 5.08 M/uL (ref 4.69–6.13)
RDW, POC: 15.7 %
WBC: 5.8 10*3/uL (ref 4.6–10.2)

## 2014-10-08 LAB — POCT INFLUENZA A/B
INFLUENZA A, POC: NEGATIVE
Influenza B, POC: NEGATIVE

## 2014-10-08 MED ORDER — HYDROCODONE-ACETAMINOPHEN 7.5-325 MG/15ML PO SOLN
5.0000 mL | ORAL | Status: DC | PRN
Start: 2014-10-08 — End: 2016-04-14

## 2014-10-08 MED ORDER — MUCINEX DM MAXIMUM STRENGTH 60-1200 MG PO TB12: 1.0000 | ORAL_TABLET | Freq: Two times a day (BID) | ORAL | Status: DC

## 2014-10-08 MED ORDER — AMOXICILLIN-POT CLAVULANATE 875-125 MG PO TABS: 1.0000 | ORAL_TABLET | Freq: Two times a day (BID) | ORAL | Status: DC

## 2014-10-08 MED ORDER — IBUPROFEN 600 MG PO TABS: 600.0000 mg | ORAL_TABLET | Freq: Three times a day (TID) | ORAL | Status: DC | PRN

## 2014-10-08 MED ORDER — TERBINAFINE HCL 250 MG PO TABS: 250.0000 mg | ORAL_TABLET | Freq: Every day | ORAL | Status: DC

## 2014-10-08 NOTE — Patient Instructions (Signed)

## 2014-10-08 NOTE — Progress Notes (Addendum)
Subjective:    Patient ID: [name], male    DOB: 08-Jan-1961, 54 y.o.   MRN: [id number]  HPI Chief Complaint  Patient presents with  . Headache    x 2 day  . Pressure Behind the Eyes    x 2 days  . Medication Refill    Lamisil   . chest congestion    x 2 days  . Cough    x 2 days   This chart was scribed for Norberto Sorenson, MD by Andrew Au, ED Scribe. This patient was seen in room 1 and the patient's care was started at 5:10 PM.  HPI Comments: [name] is a 54 y.o. male who presents to the [facility] complaining of sinus pressure that began 2 days ago. Pt reports symptoms of chest congestion, mild productive cough, decreased appetite, HA, feeling feverish with chills, some muscle aches and mild wheezing. He reports he hasn't had much of an appetite but has been consuming a good amount of fluids. He has tried tylenol sinus and cold and BC powder without relief symptoms. Pt is a smoker and smokes about a half a pack a day. Pt denies blowing mucous out of his nose. Pt denies recent abx and taking allergy medication regularly. He denies receiving flu shot.   Pt also c/o fungus to right thumb not healed.  Past Medical History  Diagnosis Date  . Hypertension    Past Surgical History  Procedure Laterality Date  . 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 Authorizing Provider  lisinopril-hydrochlorothiazide (PRINZIDE,ZESTORETIC) 20-12.5 MG per tablet Take 1 tablet by mouth daily. 07/08/14  Yes Elvina Sidle, MD  terbinafine (LAMISIL) 250 MG tablet Take 1 tablet (250 mg total) by mouth daily. 07/08/14  Yes Elvina Sidle, MD   Review of Systems  Constitutional: Positive for fever, chills, diaphoresis, activity change, appetite change and fatigue. Negative for unexpected weight change.  HENT: Positive for congestion, postnasal drip and sinus pressure. Negative  for ear pain, facial swelling, mouth sores, rhinorrhea, trouble swallowing and voice change.   Eyes: Negative for pain.  Respiratory: Positive for cough and wheezing.   Gastrointestinal: Negative for nausea, vomiting, abdominal pain, diarrhea and abdominal distention.  Genitourinary: Positive for decreased urine volume. Negative for dysuria and frequency.  Musculoskeletal: Positive for myalgias.  Skin: Negative for rash.  Allergic/Immunologic: Negative for immunocompromised state.  Neurological: Positive for weakness and headaches. Negative for dizziness, syncope and speech difficulty.  Psychiatric/Behavioral: Positive for sleep disturbance.       Objective:   Physical Exam  Constitutional: He is oriented to person, place, and time. He appears well-developed and well-nourished. No distress.  HENT:  Head: Normocephalic and atraumatic.  Right Ear: Tympanic membrane, external ear and ear canal normal.  Left Ear: Tympanic membrane, external ear and ear canal normal.  Nose: Mucosal edema and rhinorrhea present. Right sinus exhibits maxillary sinus tenderness and frontal sinus tenderness. Left sinus exhibits maxillary sinus tenderness and frontal sinus tenderness.  Mouth/Throat: Uvula is midline and mucous membranes are normal. Posterior oropharyngeal edema and posterior oropharyngeal erythema present. No oropharyngeal exudate.  Bilateral nasal mucous. Oropharynx with post nasal drip.   Eyes: Conjunctivae and EOM are normal. No scleral icterus.  Neck: Normal range of motion. Neck supple. No thyromegaly present.  Cardiovascular: Regular rhythm, normal heart sounds and intact distal  pulses.  Tachycardia present.  Exam reveals no gallop and no friction rub.   No murmur heard. Pulmonary/Chest: Effort normal and breath sounds normal. No respiratory distress.  Lungs are clear with good are movement.  Abdominal: Soft. Bowel sounds are normal. He exhibits no distension and no mass. There is no  tenderness. There is no rebound and no guarding.  Musculoskeletal: Normal range of motion. He exhibits no edema.  Lymphadenopathy:    He has no cervical adenopathy.  Neurological: He is alert and oriented to person, place, and time.  Skin: Skin is warm. He is diaphoretic. No erythema. No pallor.  Skin is warm and clammy   Psychiatric: He has a normal mood and affect. His behavior is normal.  Nursing note and vitals reviewed.  Filed Vitals:   10/08/14 1625  BP: 140/80  Pulse: 114  Temp: 101.3 F (38.5 C)  TempSrc: Oral  Resp: 18  Height: 5' 11.5" (1.816 m)  Weight: 203 lb 12.8 oz (92.443 kg)  SpO2: 95%   Results for orders placed or performed in visit on 10/08/14  POCT Influenza A/B  Result Value Ref Range   Influenza A, POC Negative    Influenza B, POC Negative    Assessment & Plan:   1. Headache, unspecified headache type   2. Cough   3. Acute sinusitis, recurrence not specified, unspecified location   4. Polypharmacy   5. Nail fungal infection - on Right thumb - restart lamisil - pt states only took for 1 mo so likely just needs longer course - take until resolved - use bid top vics vaporub to nail to expidite.  6. Onychomycosis   7. HTN - careful w/ otc cough/cold  Meds, rehceck at f/u    Meds ordered this encounter  Medications  . amoxicillin-clavulanate (AUGMENTIN) 875-125 MG per tablet    Sig: Take 1 tablet by mouth 2 (two) times daily.    Dispense:  20 tablet    Refill:  0  . HYDROcodone-acetaminophen (HYCET) 7.5-325 mg/15 ml solution    Sig: Take 5-10 mLs by mouth every 4 (four) hours as needed for moderate pain.    Dispense:  140 mL    Refill:  0  . ibuprofen (ADVIL,MOTRIN) 600 MG tablet    Sig: Take 1 tablet (600 mg total) by mouth every 8 (eight) hours as needed.    Dispense:  30 tablet    Refill:  0  . Dextromethorphan-Guaifenesin (MUCINEX DM MAXIMUM STRENGTH) 60-1200 MG TB12    Sig: Take 1 tablet by mouth every 12 (twelve) hours.    Dispense:  30  each    Refill:  0  . terbinafine (LAMISIL) 250 MG tablet    Sig: Take 1 tablet (250 mg total) by mouth daily.    Dispense:  30 tablet    Refill:  2    I personally performed the services described in this documentation, which was scribed in my presence. The recorded information has been reviewed and considered, and addended by me as needed.  Norberto Sorenson, MD MPH   Results for orders placed or performed in visit on 10/08/14  Comprehensive metabolic panel  Result Value Ref Range   Sodium 137 135 - 145 mEq/L   Potassium 4.3 3.5 - 5.3 mEq/L   Chloride 101 96 - 112 mEq/L   CO2 27 19 - 32 mEq/L   Glucose, Bld 100 (H) 70 - 99 mg/dL   BUN 13 6 - 23 mg/dL   Creat 4.09 8.11 -  1.35 mg/dL   Total Bilirubin 0.2 0.2 - 1.2 mg/dL   Alkaline Phosphatase 61 39 - 117 U/L   AST 32 0 - 37 U/L   ALT 33 0 - 53 U/L   Total Protein 6.8 6.0 - 8.3 g/dL   Albumin 3.9 3.5 - 5.2 g/dL   Calcium 9.3 8.4 - 40.910.5 mg/dL  POCT Influenza A/B  Result Value Ref Range   Influenza A, POC Negative    Influenza B, POC Negative   POCT CBC  Result Value Ref Range   WBC 5.8 4.6 - 10.2 K/uL   Lymph, poc 1.2 0.6 - 3.4   POC LYMPH PERCENT 20.5 10 - 50 %L   MID (cbc) 0.1 0 - 0.9   POC MID % 1.2 0 - 12 %M   POC Granulocyte 4.5 2 - 6.9   Granulocyte percent 78.3 37 - 80 %G   RBC 5.08 4.69 - 6.13 M/uL   Hemoglobin 14.6 14.1 - 18.1 g/dL   HCT, POC 81.145.1 91.443.5 - 53.7 %   MCV 88.7 80 - 97 fL   MCH, POC 28.7 27 - 31.2 pg   MCHC 32.3 31.8 - 35.4 g/dL   RDW, POC 78.215.7 %   Platelet Count, POC 197 142 - 424 K/uL   MPV 7.6 0 - 99.8 fL

## 2014-10-09 LAB — COMPREHENSIVE METABOLIC PANEL
ALBUMIN: 3.9 g/dL (ref 3.5–5.2)
ALK PHOS: 61 U/L (ref 39–117)
ALT: 33 U/L (ref 0–53)
AST: 32 U/L (ref 0–37)
BUN: 13 mg/dL (ref 6–23)
CO2: 27 meq/L (ref 19–32)
Calcium: 9.3 mg/dL (ref 8.4–10.5)
Chloride: 101 mEq/L (ref 96–112)
Creat: 1.11 mg/dL (ref 0.50–1.35)
GLUCOSE: 100 mg/dL — AB (ref 70–99)
Potassium: 4.3 mEq/L (ref 3.5–5.3)
SODIUM: 137 meq/L (ref 135–145)
TOTAL PROTEIN: 6.8 g/dL (ref 6.0–8.3)
Total Bilirubin: 0.2 mg/dL (ref 0.2–1.2)

## 2014-10-10 ENCOUNTER — Encounter: Payer: Self-pay | Admitting: Family Medicine

## 2015-05-24 ENCOUNTER — Other Ambulatory Visit: Payer: Self-pay | Admitting: Family Medicine

## 2015-05-25 ENCOUNTER — Other Ambulatory Visit: Payer: Self-pay | Admitting: Family Medicine

## 2015-08-31 ENCOUNTER — Other Ambulatory Visit: Payer: Self-pay | Admitting: Family Medicine

## 2016-01-16 ENCOUNTER — Telehealth: Payer: Self-pay

## 2016-01-16 MED ORDER — LISINOPRIL-HYDROCHLOROTHIAZIDE 20-12.5 MG PO TABS: 1.0000 | ORAL_TABLET | Freq: Every day | ORAL | Status: DC

## 2016-01-16 NOTE — Telephone Encounter (Signed)
Pt is completely out of blood pressure meds -he has been out a week and is starting to feel bad   Please call in refill to [facility] on [address]

## 2016-01-16 NOTE — Telephone Encounter (Signed)
Left VM stating he would receive just a one month refill and he needs an OV for future refills.

## 2016-04-02 ENCOUNTER — Ambulatory Visit (INDEPENDENT_AMBULATORY_CARE_PROVIDER_SITE_OTHER): Payer: BLUE CROSS/BLUE SHIELD | Admitting: Physician Assistant

## 2016-04-02 ENCOUNTER — Ambulatory Visit (INDEPENDENT_AMBULATORY_CARE_PROVIDER_SITE_OTHER): Payer: Worker's Compensation | Admitting: Physician Assistant

## 2016-04-02 VITALS — BP 201/122 | HR 71 | Temp 98.2°F | Resp 18 | Ht 73.0 in | Wt 211.2 lb

## 2016-04-02 DIAGNOSIS — S41111A Laceration without foreign body of right upper arm, initial encounter: Secondary | ICD-10-CM | POA: Diagnosis not present

## 2016-04-02 DIAGNOSIS — I10 Essential (primary) hypertension: Secondary | ICD-10-CM

## 2016-04-02 DIAGNOSIS — Z23 Encounter for immunization: Secondary | ICD-10-CM

## 2016-04-02 DIAGNOSIS — F17200 Nicotine dependence, unspecified, uncomplicated: Secondary | ICD-10-CM

## 2016-04-02 MED ORDER — VARENICLINE TARTRATE 0.5 MG X 11 & 1 MG X 42 PO MISC: ORAL | 0 refills | Status: DC

## 2016-04-02 MED ORDER — LISINOPRIL-HYDROCHLOROTHIAZIDE 20-12.5 MG PO TABS: 1.0000 | ORAL_TABLET | Freq: Every day | ORAL | 1 refills | Status: DC

## 2016-04-02 MED ORDER — VARENICLINE TARTRATE 1 MG PO TABS: 1.0000 mg | ORAL_TABLET | Freq: Two times a day (BID) | ORAL | 1 refills | Status: DC

## 2016-04-02 NOTE — Patient Instructions (Signed)
Return in 10 days to have sutures removed. Have a great day!!!   IF you received an x-ray today, you will receive an invoice from [facility]. Please contact [facility] at [phone] with questions or concerns regarding your invoice.   IF you received labwork today, you will receive an invoice from [employer]. Please contact [facility] at [phone] with questions or concerns regarding your invoice.   Our billing staff will not be able to assist you with questions regarding bills from these companies.  You will be contacted with the lab results as soon as they are available. The fastest way to get your results is to activate your My Chart account. Instructions are located on the last page of this paperwork. If you have not heard from [place] regarding the results in 2 weeks, please contact this office.      Laceration Care, Adult A laceration is a cut that goes through all of the layers of the skin and into the tissue that is right under the skin. Some lacerations heal on their own. Others need to be closed with stitches (sutures), staples, skin adhesive strips, or skin glue. Proper laceration care minimizes the risk of infection and helps the laceration to heal better. HOW TO CARE FOR YOUR LACERATION If sutures or staples were used:  Keep the wound clean and dry.  If you were given a bandage (dressing), you should change it at least one time per day or as told by your health care provider. You should also change it if it becomes wet or dirty.  Keep the wound completely dry for the first 24 hours or as told by your health care provider. After that time, you may shower or bathe. However, make sure that the wound is not soaked in water until after the sutures or staples have been removed.  Clean the wound one time each day or as told by your health care provider:  Wash the wound with soap and water.  Rinse the wound with water to remove all  soap.  Pat the wound dry with a clean towel. Do not rub the wound.  After cleaning the wound, apply a thin layer of antibiotic ointmentas told by your health care provider. This will help to prevent infection and keep the dressing from sticking to the wound.  Have the sutures or staples removed as told by your health care provider. If skin adhesive strips were used:  Keep the wound clean and dry.  If you were given a bandage (dressing), you should change it at least one time per day or as told by your health care provider. You should also change it if it becomes dirty or wet.  Do not get the skin adhesive strips wet. You may shower or bathe, but be careful to keep the wound dry.  If the wound gets wet, pat it dry with a clean towel. Do not rub the wound.  Skin adhesive strips fall off on their own. You may trim the strips as the wound heals. Do not remove skin adhesive strips that are still stuck to the wound. They will fall off in time. If skin glue was used:  Try to keep the wound dry, but you may briefly wet it in the shower or bath. Do not soak the wound in water, such as by swimming.  After you have showered or bathed, gently pat the wound dry with a clean towel. Do not rub the wound.  Do not do any  activities that will make you sweat heavily until the skin glue has fallen off on its own.  Do not apply liquid, cream, or ointment medicine to the wound while the skin glue is in place. Using those may loosen the film before the wound has healed.  If you were given a bandage (dressing), you should change it at least one time per day or as told by your health care provider. You should also change it if it becomes dirty or wet.  If a dressing is placed over the wound, be careful not to apply tape directly over the skin glue. Doing that may cause the glue to be pulled off before the wound has healed.  Do not pick at the glue. The skin glue usually remains in place for 5-10 days, then  it falls off of the skin. General Instructions  Take over-the-counter and prescription medicines only as told by your health care provider.  If you were prescribed an antibiotic medicine or ointment, take or apply it as told by your doctor. Do not stop using it even if your condition improves.  To help prevent scarring, make sure to cover your wound with sunscreen whenever you are outside after stitches are removed, after adhesive strips are removed, or when glue remains in place and the wound is healed. Make sure to wear a sunscreen of at least 30 SPF.  Do not scratch or pick at the wound.  Keep all follow-up visits as told by your health care provider. This is important.  Check your wound every day for signs of infection. Watch for:  Redness, swelling, or pain.  Fluid, blood, or pus.  Raise (elevate) the injured area above the level of your heart while you are sitting or lying down, if possible. SEEK MEDICAL CARE IF:  You received a tetanus shot and you have swelling, severe pain, redness, or bleeding at the injection site.  You have a fever.  A wound that was closed breaks open.  You notice a bad smell coming from your wound or your dressing.  You notice something coming out of the wound, such as wood or glass.  Your pain is not controlled with medicine.  You have increased redness, swelling, or pain at the site of your wound.  You have fluid, blood, or pus coming from your wound.  You notice a change in the color of your skin near your wound.  You need to change the dressing frequently due to fluid, blood, or pus draining from the wound.  You develop a new rash.  You develop numbness around the wound. SEEK IMMEDIATE MEDICAL CARE IF:  You develop severe swelling around the wound.  Your pain suddenly increases and is severe.  You develop painful lumps near the wound or on skin that is anywhere on your body.  You have a red streak going away from your  wound.  The wound is on your hand or foot and you cannot properly move a finger or toe.  The wound is on your hand or foot and you notice that your fingers or toes look pale or bluish.   This information is not intended to replace advice given to you by your health care provider. Make sure you discuss any questions you have with your health care provider.   Document Released: 06/15/2005 Document Revised: 10/30/2014 Document Reviewed: 06/11/2014 Elsevier Interactive Patient Education [employer]2016 [employer] Inc.

## 2016-04-02 NOTE — Progress Notes (Signed)
04/02/2016 1:32 PM   DOB: 12-26-1960 / MRN: [id number]  SUBJECTIVE:  [name] is a 55 y.o. male presenting for a laceration on his right wrist prior to arrival at work after being "attacked by a sink".  Patient caught a stainless steel sink with a sharp edge which fell about 2 feet. He denies any change in range of motion or function in his wrist.  Denies paresthesia past the injury. Patients last tetanus shot was about 5 years ago.    Hypertension: Patient takes Lisinopril for his high blood pressure but states that he has run out and has not taken any for the past 10 days.  He has been "busy with work" so he has not had a chance to have it refilled. Denies chest pain, dizziness, vision changes, nausea/vomitting, headache. He does not have a blood pressure cuff at home but is willing to purchase one.  blood pressure left arm: 210/128  7.5 pack year history of smoking. He has tried gum and this did not work for him.  He would like to try a medication.    He has No Known Allergies.   He  has a past medical history of Hypertension.    He  reports that he has been smoking Cigarettes.  He has never used smokeless tobacco. He reports that he drinks alcohol. He reports that he does not use drugs. He  has no sexual activity history on file. The patient  has a past surgical history that includes Amputation finger / thumb.  His family history includes Hypertension in his father.  Review of Systems  Constitutional: Negative for chills and fever.  Eyes: Negative for pain, discharge and redness.  Respiratory: Negative for shortness of breath.   Cardiovascular: Negative for chest pain.  Gastrointestinal: Negative for nausea and vomiting.    The problem list and medications were reviewed and updated by myself where necessary and exist elsewhere in the encounter.   OBJECTIVE:  BP (!) 210/128 (BP Location: Left Arm, Cuff Size: Normal) Comment: ran out bp med x1 month ago  Pulse 86   Temp 98.2 F  (36.8 C) (Oral)   Resp 18   Ht 6\' 1"  (1.854 m)   Wt 211 lb 3.2 oz (95.8 kg)   SpO2 99%   BMI 27.86 kg/m    Procedure; patient anesthetized with 2% lidocaine with epinephrine.  Wound prepped with betadine. fenestrated drape applied, wound repaired with 4-0 proline using 4 horizontal mattress sutures. Patient tolerated procedure well.   Physical Exam  Constitutional: He is oriented to person, place, and time. He appears well-developed and well-nourished. No distress.  HENT:  Head: Normocephalic and atraumatic.  Eyes: EOM are normal.  Cardiovascular: Normal rate, regular rhythm and normal heart sounds.   Pulmonary/Chest: Breath sounds normal. No respiratory distress.  Musculoskeletal: Normal range of motion.  Right side: 2 cm obliquely oriented laceration, oriented in the transverse plane just proximal to the distal ulnar styloid.  Finger extension and wrist extension strength 5/5.   Neurological: He is alert and oriented to person, place, and time.  Skin: Skin is warm and dry.  Psychiatric: He has a normal mood and affect. His behavior is normal.    No results found for this or any previous visit (from the past 72 hour(s)).  No results found.  ASSESSMENT AND PLAN  [name] was seen today for wrist injury.  Diagnoses and all orders for this visit:  Asymptomatic hypertension -     lisinopril-hydrochlorothiazide (PRINZIDE,ZESTORETIC) 20-12.5  MG tablet; Take 1 tablet by mouth daily.  Laceration of right upper extremity, initial encounter: Will see him back in 10 days for suture removal.   -     Tdap vaccine greater than or equal to 7yo IM  Smoker -     varenicline (CHANTIX STARTING MONTH PAK) 0.5 MG X 11 & 1 MG X 42 tablet; Take one 0.5 mg tablet by mouth once daily for 3 days, then increase to one 0.5 mg tablet twice daily for 4 days, then increase to one 1 mg tablet twice daily. -     varenicline (CHANTIX CONTINUING MONTH PAK) 1 MG tablet; Take 1 tablet (1 mg total) by mouth 2 (two)  times daily.   This note was scribed in my presence and I performed the services described in the this documentation.    The patient is advised to call or return to clinic if he does not see an improvement in symptoms, or to seek the care of the closest emergency department if he worsens with the above plan.   Deliah Boston, MHS, PA-C [facility] 04/02/2016 1:32 PM

## 2016-04-09 ENCOUNTER — Telehealth: Payer: Self-pay

## 2016-04-09 NOTE — Telephone Encounter (Signed)
[facility] and there needs to be an addendum and another DOS done on his chart. I have placed the information in your box-please see notes on forms. If you will give to me when you are done I will forward to the [facility] dept. Thank you

## 2016-04-10 NOTE — Telephone Encounter (Signed)
I see what happened here.  If we could enter a new encounter into epic for the same day of service and make the chief complaint asymptomatic hypertension and smoking cessation I will complete that encounter as well as addend the [facility] encounter to reflect.  My apologies for the extra work on everyone. Deliah BostonMichael Taisha Pennebaker, MS, PA-C 1:24 PM, 04/10/2016

## 2016-04-14 ENCOUNTER — Ambulatory Visit (INDEPENDENT_AMBULATORY_CARE_PROVIDER_SITE_OTHER): Payer: Worker's Compensation | Admitting: Physician Assistant

## 2016-04-14 VITALS — BP 160/100 | HR 74 | Temp 98.3°F | Resp 17 | Ht 73.0 in | Wt 213.0 lb

## 2016-04-14 DIAGNOSIS — Z4802 Encounter for removal of sutures: Secondary | ICD-10-CM

## 2016-04-14 NOTE — Progress Notes (Signed)
04/14/2016 1:47 PM   DOB: 1961/04/08 / MRN: [id number]  SUBJECTIVE:  [name] is a 55 y.o. male presenting for suture removal.  Denies chills, nausea, skin tenderness.  Reports no problems relative to the wound.   He is willing to come back at another time to discuss his HTN which has improved since his last visit.   He has No Known Allergies.   He  has a past medical history of Hypertension.     Review of Systems  Constitutional: Negative for chills.  Skin: Negative for rash.  Neurological: Negative for dizziness and headaches.    The problem list and medications were reviewed and updated by myself where necessary and exist elsewhere in the encounter.   OBJECTIVE:  BP (!) 160/100 (BP Location: Right Arm, Patient Position: Sitting, Cuff Size: Normal)   Pulse 74   Temp 98.3 F (36.8 C) (Oral)   Resp 17   Ht 6\' 1"  (1.854 m)   Wt 213 lb (96.6 kg)   SpO2 97%   BMI 28.10 kg/m   Physical Exam  Constitutional: He is oriented to person, place, and time.  Musculoskeletal: Normal range of motion.  Neurological: He is alert and oriented to person, place, and time.  Skin:  Four sutures removed from the right upper extremity without complication.  No tenderness or purulence about the wound.   Vitals reviewed.   No results found for this or any previous visit (from the past 72 hour(s)).  No results found.  ASSESSMENT AND PLAN  [name] was seen today for suture / staple removal and medication refill.  Diagnoses and all orders for this visit:  Visit for suture removal: Removed here without complication.  Will see him back in about two weeks for HTN recheck.      The patient is advised to call or return to clinic if he does not see an improvement in symptoms, or to seek the care of the closest emergency department if he worsens with the above plan.   [name], MHS, PA-C [facility] 04/14/2016 1:47 PM

## 2016-04-14 NOTE — Patient Instructions (Signed)
     IF you received an x-ray today, you will receive an invoice from [facility]. Please contact  Radiology at [phone] with questions or concerns regarding your invoice.   IF you received labwork today, you will receive an invoice from [facility]. Please contact [facility] at [phone] with questions or concerns regarding your invoice.   Our billing staff will not be able to assist you with questions regarding bills from these companies.  You will be contacted with the lab results as soon as they are available. The fastest way to get your results is to activate your My Chart account. Instructions are located on the last page of this paperwork. If you have not heard from us regarding the results in 2 weeks, please contact this office.      

## 2016-05-05 NOTE — Progress Notes (Signed)
   10.5.17   DOB: 10/20/1961 / MRN: [id number]  SUBJECTIVE:  [name] is a 55 y.o. male presenting for elevated BP and smoking cessation.  Has a long history of HTN and is currently out of his medication.  He denies HA, chest pain, vision changes, change in sensation at this time.  He would like to quit smoking today.  Has tried several OTC methods and this has not worked for him.   He has No Known Allergies.   He  has a past medical history of Hypertension.    He  reports that he has been smoking Cigarettes.  He has never used smokeless tobacco. He reports that he drinks alcohol. He reports that he does not use drugs. He  has no sexual activity history on file. The patient  has a past surgical history that includes Amputation finger / thumb.  His family history includes Hypertension in his father.  Review of Systems  Eyes: Negative for blurred vision and double vision.  Respiratory: Negative for shortness of breath.   Cardiovascular: Negative for chest pain and leg swelling.  Neurological: Negative for dizziness, weakness and headaches.    The problem list and medications were reviewed and updated by myself where necessary and exist elsewhere in the encounter.   OBJECTIVE:   Physical Exam  Constitutional: He is oriented to person, place, and time. He appears well-developed. He does not appear ill.  Eyes: Conjunctivae and EOM are normal. Pupils are equal, round, and reactive to light.  Cardiovascular: Normal rate.   Pulmonary/Chest: Effort normal.  Abdominal: He exhibits no distension.  Musculoskeletal: Normal range of motion.  Neurological: He is alert and oriented to person, place, and time. No cranial nerve deficit. Coordination normal.  Skin: Skin is warm and dry. He is not diaphoretic.  Psychiatric: He has a normal mood and affect.  Nursing note and vitals reviewed.  BP (!) 210/128 (BP Location: Left Arm, Cuff Size: Normal) Comment: ran out bp med x1 month ago  Pulse  86   Temp 98.2 F (36.8 C) (Oral)   Resp 18   Ht 6\' 1"  (1.854 m)   Wt 211 lb 3.2 oz (95.8 kg)   SpO2 99%   BMI 27.86 kg/m    No results found for this or any previous visit (from the past 72 hour(s)).  No results found.  ASSESSMENT AND PLAN  Diagnoses and all orders for this visit:  Uncontrolled stage 2 hypertension: Will go ahead and start him on a dual agent given his pressure is so high today.  Fortunately he is asymptomatic. I will see him back in about 1 month for an HTN recheck.   Smoker: Starting chantix.  He has got to stop smoking as his history of HTN poses a huge risk to his cardiovascular health.     The patient is advised to call or return to clinic if he does not see an improvement in symptoms, or to seek the care of the closest emergency department if he worsens with the above plan.   Deliah BostonMichael Carzell Saldivar, MHS, PA-C [facility] 05/05/2016 3:35 PM

## 2016-12-02 ENCOUNTER — Telehealth: Payer: Self-pay | Admitting: General Practice

## 2016-12-02 DIAGNOSIS — I10 Essential (primary) hypertension: Secondary | ICD-10-CM

## 2016-12-02 NOTE — Telephone Encounter (Signed)
Pt is needing a refill on his blood pressure meds   Best number [phone]

## 2016-12-03 MED ORDER — LISINOPRIL-HYDROCHLOROTHIAZIDE 20-12.5 MG PO TABS: 1.0000 | ORAL_TABLET | Freq: Every day | ORAL | 0 refills | Status: DC

## 2016-12-09 ENCOUNTER — Other Ambulatory Visit: Payer: Self-pay | Admitting: Physician Assistant

## 2016-12-09 DIAGNOSIS — I10 Essential (primary) hypertension: Secondary | ICD-10-CM

## 2016-12-10 ENCOUNTER — Telehealth: Payer: Self-pay | Admitting: General Practice

## 2016-12-10 NOTE — Telephone Encounter (Signed)
Called and spoke with patient, he got his BP meds. for 30 days, on June 7-th, but to have another refill he need an OV. He said that is going to check his schedule, and will call for an appointment.

## 2016-12-10 NOTE — Telephone Encounter (Signed)
Pt is checking on status of his blood pressure meds request that was originally sent in on 12/02/16 and then pharmacy requested again on 12/09/16   Please give pt a call at [phone]

## 2020-04-16 ENCOUNTER — Other Ambulatory Visit: Payer: Self-pay

## 2020-04-16 ENCOUNTER — Ambulatory Visit (HOSPITAL_COMMUNITY)
Admission: EM | Admit: 2020-04-16 | Discharge: 2020-04-16 | Disposition: A | Discharge status: Home / Self Care | Payer: BLUE CROSS/BLUE SHIELD | Attending: Family Medicine | Admitting: Family Medicine

## 2020-04-16 ENCOUNTER — Encounter (HOSPITAL_COMMUNITY): Payer: Self-pay

## 2020-04-16 DIAGNOSIS — I10 Essential (primary) hypertension: Secondary | ICD-10-CM | POA: Diagnosis not present

## 2020-04-16 MED ORDER — AMLODIPINE BESYLATE 5 MG PO TABS: 5.0000 mg | ORAL_TABLET | Freq: Every day | ORAL | 0 refills | Status: AC

## 2020-04-16 MED ORDER — LISINOPRIL-HYDROCHLOROTHIAZIDE 20-25 MG PO TABS: 1.0000 | ORAL_TABLET | Freq: Every day | ORAL | 0 refills | Status: AC

## 2020-04-16 NOTE — ED Notes (Signed)
Dr. Adela Glimpse made aware of patient BP

## 2020-04-16 NOTE — ED Triage Notes (Signed)
Patient in with c/o HTN yesterday. States he did not take his BP meds and ate chitterlings the night before and when bp was checked yesterda reading was 166/110.  Has had lisinopril today  Denies any n/v, headache, CP, dizziness, sob, vision changes,

## 2020-04-16 NOTE — ED Provider Notes (Signed)
[facility]   [id number] 04/16/20 Arrival Time: 1146  ASSESSMENT & PLAN:  1. Uncontrolled hypertension   2. Asymptomatic hypertension     Increased HCTZ component to 25mg . Add Norvasc. He has f/u with PCP on 05/09/2020. May return here in a couple of weeks to recheck if he desires.   Meds ordered this encounter  Medications  . lisinopril-hydrochlorothiazide (ZESTORETIC) 20-25 MG tablet    Sig: Take 1 tablet by mouth daily.    Dispense:  30 tablet    Refill:  0  . amLODipine (NORVASC) 5 MG tablet    Sig: Take 1 tablet (5 mg total) by mouth daily.    Dispense:  30 tablet    Refill:  0   No s/s of hypertensive urgency.  Reviewed expectations re: course of current medical issues. Questions answered. Outlined signs and symptoms indicating need for more acute intervention. Patient verbalized understanding. After Visit Summary given.   SUBJECTIVE:  [name] is a 59 y.o. male who presents with concerns regarding increased blood pressures. He reports that he is treated for hypertension.  He reports taking medications as instructed, no medication side effects noted, no chest pain on exertion, no dyspnea on exertion, no swelling of ankles, no orthostatic dizziness or lightheadedness, no orthopnea or paroxysmal nocturnal dyspnea and no palpitations.   Social History   Tobacco Use  Smoking Status Current Every Day Smoker  . Types: Cigarettes  Smokeless Tobacco Never Used     OBJECTIVE:  Vitals:   04/16/20 1233  BP: (!) 197/123  Pulse: 88  Resp: 18  Temp: 98.3 F (36.8 C)  TempSrc: Oral  SpO2: 97%    General appearance: alert; no distress Eyes: PERRLA; EOMI HENT: normocephalic; atraumatic Neck: supple Lungs: speaks full sentences without difficulty; unlabored Heart: regular Extremities: no edema; symmetrical with no gross deformities Skin: warm and dry Psychological: alert and cooperative; normal mood and affect   No Known Allergies  Past  Medical History:  Diagnosis Date  . Hypertension    Social History   Socioeconomic History  . Marital status: Single    Spouse name: Not on file  . Number of children: Not on file  . Years of education: Not on file  . Highest education level: Not on file  Occupational History  . Not on file  Tobacco Use  . Smoking status: Current Every Day Smoker    Types: Cigarettes  . Smokeless tobacco: Never Used  Substance and Sexual Activity  . Alcohol use: Yes    Alcohol/week: 0.0 standard drinks  . Drug use: No  . Sexual activity: Not on file  Other Topics Concern  . Not on file  Social History Narrative  . Not on file   Social Determinants of Health   Financial Resource Strain:   . Difficulty of Paying Living Expenses: Not on file  Food Insecurity:   . Worried About 04/18/20 in the Last Year: Not on file  . Ran Out of Food in the Last Year: Not on file  Transportation Needs:   . Lack of Transportation (Medical): Not on file  . Lack of Transportation (Non-Medical): Not on file  Physical Activity:   . Days of Exercise per Week: Not on file  . Minutes of Exercise per Session: Not on file  Stress:   . Feeling of Stress : Not on file  Social Connections:   . Frequency of Communication with Friends and Family: Not on file  . Frequency of  Social Gatherings with Friends and Family: Not on file  . Attends Religious Services: Not on file  . Active Member of Clubs or Organizations: Not on file  . Attends Banker Meetings: Not on file  . Marital Status: Not on file  Intimate Partner Violence:   . Fear of Current or Ex-Partner: Not on file  . Emotionally Abused: Not on file  . Physically Abused: Not on file  . Sexually Abused: Not on file   Family History  Problem Relation Age of Onset  . Hypertension Father    Past Surgical History:  Procedure Laterality Date  . 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

## 2020-04-19 ENCOUNTER — Ambulatory Visit
Admission: EM | Admit: 2020-04-19 | Discharge: 2020-04-19 | Disposition: A | Discharge status: Home / Self Care | Payer: BLUE CROSS/BLUE SHIELD

## 2020-04-19 DIAGNOSIS — Z7189 Other specified counseling: Secondary | ICD-10-CM

## 2020-04-19 DIAGNOSIS — I10 Essential (primary) hypertension: Secondary | ICD-10-CM | POA: Diagnosis not present

## 2020-04-19 NOTE — ED Provider Notes (Signed)
[facility]    CSN: [id number] Arrival date & time: 04/19/20  1047      History   Chief Complaint Chief Complaint  Patient presents with  . Follow-up    HPI [name] is a 59 y.o. male  Presenting for blood pressure check.  Seen initially for this 04/16/2020: Please see the records, reviewed by me at time of visit.  Reports compliance with amlodipine, though was unaware that he should be taking lisinopril-HCTZ at the same time.  Otherwise feels well.  Will check blood pressure at home: Systolics in the 150s, diastolic 90s.  Past Medical History:  Diagnosis Date  . Hypertension     Patient Active Problem List   Diagnosis Date Noted  . Unspecified essential hypertension 06/02/2013    Past Surgical History:  Procedure Laterality Date  . 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 Authorizing Provider  lisinopril-hydrochlorothiazide (ZESTORETIC) 20-25 MG tablet Take 1 tablet by mouth daily. 04/16/20  Yes Hagler, Arlys John, MD  amLODipine (NORVASC) 5 MG tablet Take 1 tablet (5 mg total) by mouth daily. 04/16/20   Mardella Layman, MD  lisinopril (PRINIVIL,ZESTRIL) 20 MG tablet Take 20 mg by mouth daily.  08/22/12  [provider]    Family History Family History  Problem Relation Age of Onset  . Hypertension Father     Social History Social History   Tobacco Use  . Smoking status: Current Every Day Smoker    Types: Cigarettes  . Smokeless tobacco: Never Used  Substance Use Topics  . Alcohol use: Yes    Alcohol/week: 0.0 standard drinks  . Drug use: No     Allergies   Patient has no known allergies.   Review of Systems Review of Systems  Respiratory: Negative for chest tightness and shortness of breath.   Cardiovascular: Negative for chest pain and palpitations.  Neurological: Negative for dizziness and  headaches.  All other systems reviewed and are negative.     Physical Exam Triage Vital Signs ED Triage Vitals  Enc Vitals Group     BP      Pulse      Resp      Temp      Temp src      SpO2      Weight      Height      Head Circumference      Peak Flow      Pain Score      Pain Loc      Pain Edu?      Excl. in GC?    No data found.  Updated Vital Signs BP (!) 152/109 (BP Location: Left Arm)   Pulse (!) 109   Temp 98.3 F (36.8 C) (Oral)   Resp 18   SpO2 96%   Visual Acuity Right Eye Distance:   Left Eye Distance:   Bilateral Distance:    Right Eye Near:   Left Eye Near:    Bilateral Near:     Physical Exam Constitutional:      General: He is not in acute distress. HENT:     Head: Normocephalic and atraumatic.  Eyes:     General: No scleral icterus.    Pupils: Pupils are equal, round, and reactive to light.  Cardiovascular:     Rate  and Rhythm: Normal rate.  Pulmonary:     Effort: Pulmonary effort is normal. No respiratory distress.     Breath sounds: No wheezing.  Skin:    Coloration: Skin is not jaundiced or pale.  Neurological:     Mental Status: He is alert and oriented to person, place, and time.      UC Treatments / Results  Labs (all labs ordered are listed, but only abnormal results are displayed) Labs Reviewed - No data to display  EKG   Radiology No results found.  Procedures Procedures (including critical care time)  Medications Ordered in UC Medications - No data to display  Initial Impression / Assessment and Plan / UC Course  I have reviewed the triage vital signs and the nursing notes.  Pertinent labs & imaging results that were available during my care of the patient were reviewed by me and considered in my medical decision making (see chart for details).     Blood pressure elevated in office today, though patient denied signs of endorgan damage.Per chart review, HCTZ component increased to 25 mg, Norvasc added.  Has follow-up with PCP 05/09/2020.  Will keep this appointment with PCP, monitor blood pressure and keep log for review.  Stressed importance of compliance with both lisinopril-HCTZ and amlodipine.  Return precautions discussed, pt verbalized understanding and is agreeable to plan. Final Clinical Impressions(s) / UC Diagnoses   Final diagnoses:  Essential hypertension  Encounter for medication counseling     Discharge Instructions     Take lisinopril-HCTA (20-25 mg) tablet in the MORNING. May take amlodipine (5 mg) tablet in the morning OR the evening (if it makes you tired). It's most important to take your BP meds at the SAME TIME EVERY DAY. Keep PCP appointment in November for further evaluation and management.    ED Prescriptions    None     PDMP not reviewed this encounter.   Hall[place]Potvin, [place], [place] 04/19/20 1120

## 2020-04-19 NOTE — ED Triage Notes (Signed)
Pt states trying to get a work physical but his b/p was elevated. States was seen at [facility] on Monday and started a new b/p med. Pt denies any sx's

## 2020-04-19 NOTE — Discharge Instructions (Addendum)
Take lisinopril-HCTA (20-25 mg) tablet in the MORNING. May take amlodipine (5 mg) tablet in the morning OR the evening (if it makes you tired). It's most important to take your BP meds at the SAME TIME EVERY DAY. Keep PCP appointment in November for further evaluation and management.

## 2023-05-27 ENCOUNTER — Encounter (HOSPITAL_COMMUNITY): Payer: Self-pay

## 2023-05-27 ENCOUNTER — Emergency Department (HOSPITAL_COMMUNITY): Payer: No Typology Code available for payment source

## 2023-05-27 ENCOUNTER — Emergency Department (HOSPITAL_COMMUNITY)
Admission: EM | Admit: 2023-05-27 | Discharge: 2023-05-27 | Disposition: A | Discharge status: Home / Self Care | Payer: No Typology Code available for payment source | Attending: Emergency Medicine | Admitting: Emergency Medicine

## 2023-05-27 ENCOUNTER — Other Ambulatory Visit: Payer: Self-pay

## 2023-05-27 DIAGNOSIS — M546 Pain in thoracic spine: Secondary | ICD-10-CM | POA: Diagnosis present

## 2023-05-27 DIAGNOSIS — Z79899 Other long term (current) drug therapy: Secondary | ICD-10-CM | POA: Diagnosis not present

## 2023-05-27 DIAGNOSIS — Y92410 Unspecified street and highway as the place of occurrence of the external cause: Secondary | ICD-10-CM | POA: Insufficient documentation

## 2023-05-27 DIAGNOSIS — M542 Cervicalgia: Secondary | ICD-10-CM | POA: Insufficient documentation

## 2023-05-27 DIAGNOSIS — M79641 Pain in right hand: Secondary | ICD-10-CM | POA: Diagnosis not present

## 2023-05-27 DIAGNOSIS — I10 Essential (primary) hypertension: Secondary | ICD-10-CM | POA: Diagnosis not present

## 2023-05-27 DIAGNOSIS — R519 Headache, unspecified: Secondary | ICD-10-CM | POA: Diagnosis not present

## 2023-05-27 MED ORDER — CYCLOBENZAPRINE HCL 5 MG PO TABS: 5.0000 mg | ORAL_TABLET | Freq: Three times a day (TID) | ORAL | 0 refills | Status: AC | PRN

## 2023-05-27 MED ORDER — HYDROCODONE-ACETAMINOPHEN 5-325 MG PO TABS
1.0000 | ORAL_TABLET | Freq: Once | ORAL | Status: AC
  Administered 2023-05-27: 1 via ORAL
  Filled 2023-05-27: qty 1

## 2023-05-27 MED ORDER — CYCLOBENZAPRINE HCL 10 MG PO TABS
10.0000 mg | ORAL_TABLET | Freq: Once | ORAL | Status: AC
  Administered 2023-05-27: 10 mg via ORAL
  Filled 2023-05-27: qty 1

## 2023-05-27 NOTE — Discharge Instructions (Addendum)
Please follow-up with your primary care doctor, return to the ER if you feel like your symptoms are worsening.  I believe that you likely have cervical sprain, and a back sprain, which is causing your pain.  There is no evidence of any rib fractures, or bruising on your exam  Your imaging was very reassuring.  Take Tylenol and ibuprofen for your pain
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
